# Patient Record
Sex: Male | Born: 1945 | Race: White | Hispanic: No | Marital: Single | Smoking: Former smoker
Health system: Southern US, Community
[De-identification: ages and names within clinical notes are randomized; demographics above are authoritative.]

## PROBLEM LIST (undated history)

## (undated) DIAGNOSIS — E785 Hyperlipidemia, unspecified: Secondary | ICD-10-CM

## (undated) DIAGNOSIS — I639 Cerebral infarction, unspecified: Secondary | ICD-10-CM

## (undated) DIAGNOSIS — R51 Headache: Principal | ICD-10-CM

## (undated) DIAGNOSIS — E119 Type 2 diabetes mellitus without complications: Secondary | ICD-10-CM

## (undated) DIAGNOSIS — K219 Gastro-esophageal reflux disease without esophagitis: Secondary | ICD-10-CM

## (undated) DIAGNOSIS — H539 Unspecified visual disturbance: Secondary | ICD-10-CM

## (undated) DIAGNOSIS — R002 Palpitations: Secondary | ICD-10-CM

## (undated) DIAGNOSIS — I10 Essential (primary) hypertension: Secondary | ICD-10-CM

## (undated) DIAGNOSIS — E0842 Diabetes mellitus due to underlying condition with diabetic polyneuropathy: Secondary | ICD-10-CM

## (undated) DIAGNOSIS — R519 Headache, unspecified: Secondary | ICD-10-CM

## (undated) HISTORY — DX: Gastro-esophageal reflux disease without esophagitis: K21.9

## (undated) HISTORY — DX: Essential (primary) hypertension: I10

## (undated) HISTORY — DX: Type 2 diabetes mellitus without complications: E11.9

## (undated) HISTORY — PX: DENTAL SURGERY: SHX609

## (undated) HISTORY — PX: OTHER SURGICAL HISTORY: SHX169

## (undated) HISTORY — DX: Headache: R51

## (undated) HISTORY — DX: Headache, unspecified: R51.9

## (undated) HISTORY — PX: BACK SURGERY: SHX140

## (undated) HISTORY — DX: Cerebral infarction, unspecified: I63.9

## (undated) HISTORY — DX: Palpitations: R00.2

## (undated) HISTORY — DX: Hyperlipidemia, unspecified: E78.5

## (undated) HISTORY — PX: SHOULDER SURGERY: SHX246

## (undated) HISTORY — DX: Unspecified visual disturbance: H53.9

## (undated) HISTORY — DX: Diabetes mellitus due to underlying condition with diabetic polyneuropathy: E08.42

---

## 2000-07-30 ENCOUNTER — Emergency Department (HOSPITAL_COMMUNITY): Admission: EM | Admit: 2000-07-30 | Discharge: 2000-07-31 | Payer: Self-pay | Admitting: Emergency Medicine

## 2001-09-04 ENCOUNTER — Other Ambulatory Visit: Admission: RE | Admit: 2001-09-04 | Discharge: 2001-09-04 | Payer: Self-pay | Admitting: Otolaryngology

## 2001-09-13 ENCOUNTER — Ambulatory Visit (HOSPITAL_COMMUNITY): Admission: RE | Admit: 2001-09-13 | Discharge: 2001-09-13 | Payer: Self-pay | Admitting: Otolaryngology

## 2001-09-13 ENCOUNTER — Encounter: Payer: Self-pay | Admitting: Otolaryngology

## 2001-09-18 ENCOUNTER — Other Ambulatory Visit: Admission: RE | Admit: 2001-09-18 | Discharge: 2001-09-18 | Payer: Self-pay | Admitting: Otolaryngology

## 2001-09-21 ENCOUNTER — Encounter: Admission: RE | Admit: 2001-09-21 | Discharge: 2001-09-21 | Payer: Self-pay | Admitting: Otolaryngology

## 2001-09-21 ENCOUNTER — Encounter: Payer: Self-pay | Admitting: Otolaryngology

## 2001-10-12 ENCOUNTER — Ambulatory Visit (HOSPITAL_BASED_OUTPATIENT_CLINIC_OR_DEPARTMENT_OTHER): Admission: RE | Admit: 2001-10-12 | Discharge: 2001-10-12 | Payer: Self-pay | Admitting: Otolaryngology

## 2013-12-16 DIAGNOSIS — R9439 Abnormal result of other cardiovascular function study: Secondary | ICD-10-CM

## 2014-01-11 DIAGNOSIS — E1122 Type 2 diabetes mellitus with diabetic chronic kidney disease: Secondary | ICD-10-CM

## 2014-01-11 DIAGNOSIS — IMO0002 Reserved for concepts with insufficient information to code with codable children: Secondary | ICD-10-CM

## 2014-01-11 DIAGNOSIS — I493 Ventricular premature depolarization: Secondary | ICD-10-CM

## 2014-01-11 DIAGNOSIS — E1165 Type 2 diabetes mellitus with hyperglycemia: Secondary | ICD-10-CM

## 2014-01-13 ENCOUNTER — Encounter (HOSPITAL_COMMUNITY)
Admission: RE | Disposition: A | Discharge status: Home / Self Care | Payer: Self-pay | Source: Ambulatory Visit | Attending: Cardiology

## 2014-01-13 ENCOUNTER — Ambulatory Visit (HOSPITAL_COMMUNITY)
Admission: RE | Admit: 2014-01-13 | Discharge: 2014-01-13 | Disposition: A | Discharge status: Home / Self Care | Payer: Medicare Other | Source: Ambulatory Visit | Attending: Cardiology | Admitting: Cardiology

## 2014-01-13 ENCOUNTER — Encounter (HOSPITAL_COMMUNITY): Payer: Self-pay | Admitting: Cardiology

## 2014-01-13 DIAGNOSIS — E785 Hyperlipidemia, unspecified: Secondary | ICD-10-CM | POA: Diagnosis not present

## 2014-01-13 DIAGNOSIS — E1122 Type 2 diabetes mellitus with diabetic chronic kidney disease: Secondary | ICD-10-CM

## 2014-01-13 DIAGNOSIS — R9439 Abnormal result of other cardiovascular function study: Secondary | ICD-10-CM

## 2014-01-13 DIAGNOSIS — E119 Type 2 diabetes mellitus without complications: Secondary | ICD-10-CM | POA: Diagnosis not present

## 2014-01-13 DIAGNOSIS — I10 Essential (primary) hypertension: Secondary | ICD-10-CM | POA: Insufficient documentation

## 2014-01-13 DIAGNOSIS — IMO0002 Reserved for concepts with insufficient information to code with codable children: Secondary | ICD-10-CM

## 2014-01-13 DIAGNOSIS — I493 Ventricular premature depolarization: Secondary | ICD-10-CM | POA: Insufficient documentation

## 2014-01-13 DIAGNOSIS — E1165 Type 2 diabetes mellitus with hyperglycemia: Secondary | ICD-10-CM

## 2014-01-13 HISTORY — PX: LEFT HEART CATHETERIZATION WITH CORONARY ANGIOGRAM: SHX5451

## 2014-01-13 LAB — GLUCOSE, CAPILLARY: GLUCOSE-CAPILLARY: 274 mg/dL — AB (ref 70–99)

## 2014-01-13 SURGERY — LEFT HEART CATHETERIZATION WITH CORONARY ANGIOGRAM
Anesthesia: LOCAL

## 2014-01-13 MED ORDER — ASPIRIN 81 MG PO CHEW
CHEWABLE_TABLET | ORAL | Status: AC
  Filled 2014-01-13: qty 1

## 2014-01-13 MED ORDER — SODIUM CHLORIDE 0.9 % IV SOLN
250.0000 mL | INTRAVENOUS | Status: DC | PRN
Start: 2014-01-13 — End: 2014-01-13

## 2014-01-13 MED ORDER — HYDROMORPHONE HCL 1 MG/ML IJ SOLN
INTRAMUSCULAR | Status: AC
  Filled 2014-01-13: qty 1

## 2014-01-13 MED ORDER — MIDAZOLAM HCL 2 MG/2ML IJ SOLN
INTRAMUSCULAR | Status: AC
  Filled 2014-01-13: qty 2

## 2014-01-13 MED ORDER — ASPIRIN 81 MG PO CHEW
81.0000 mg | CHEWABLE_TABLET | ORAL | Status: AC
  Administered 2014-01-13: 81 mg via ORAL

## 2014-01-13 MED ORDER — METFORMIN HCL 1000 MG PO TABS: 1000.0000 mg | ORAL_TABLET | Freq: Two times a day (BID) | ORAL | Status: DC

## 2014-01-13 MED ORDER — SODIUM CHLORIDE 0.9 % IV SOLN
INTRAVENOUS | Status: DC
  Administered 2014-01-13: 06:00:00 via INTRAVENOUS

## 2014-01-13 MED ORDER — SODIUM CHLORIDE 0.9 % IV SOLN: 1.0000 mL/kg/h | INTRAVENOUS | Status: DC

## 2014-01-13 MED ORDER — SODIUM CHLORIDE 0.9 % IJ SOLN: 3.0000 mL | INTRAMUSCULAR | Status: DC | PRN

## 2014-01-13 MED ORDER — SODIUM CHLORIDE 0.9 % IV BOLUS (SEPSIS)
500.0000 mL | Freq: Once | INTRAVENOUS | Status: AC
  Administered 2014-01-13: 06:00:00 via INTRAVENOUS

## 2014-01-13 MED ORDER — SODIUM CHLORIDE 0.9 % IJ SOLN: 3.0000 mL | Freq: Two times a day (BID) | INTRAMUSCULAR | Status: DC

## 2014-01-13 NOTE — Discharge Instructions (Signed)
Radial Site Care °Refer to this sheet in the next few weeks. These instructions provide you with information on caring for yourself after your procedure. Your caregiver may also give you more specific instructions. Your treatment has been planned according to current medical practices, but problems sometimes occur. Call your caregiver if you have any problems or questions after your procedure. °HOME CARE INSTRUCTIONS °· You may shower the day after the procedure. Remove the bandage (dressing) and gently wash the site with plain soap and water. Gently pat the site dry. °· Do not apply powder or lotion to the site. °· Do not submerge the affected site in water for 3 to 5 days. °· Inspect the site at least twice daily. °· Do not flex or bend the affected arm for 24 hours. °· No lifting over 5 pounds (2.3 kg) for 5 days after your procedure. °· Do not drive home if you are discharged the same day of the procedure. Have someone else drive you. °· You may drive 24 hours after the procedure unless otherwise instructed by your caregiver. °· Do not operate machinery or power tools for 24 hours. °· A responsible adult should be with you for the first 24 hours after you arrive home. °What to expect: °· Any bruising will usually fade within 1 to 2 weeks. °· Blood that collects in the tissue (hematoma) may be painful to the touch. It should usually decrease in size and tenderness within 1 to 2 weeks. °SEEK IMMEDIATE MEDICAL CARE IF: °· You have unusual pain at the radial site. °· You have redness, warmth, swelling, or pain at the radial site. °· You have drainage (other than a small amount of blood on the dressing). °· You have chills. °· You have a fever or persistent symptoms for more than 72 hours. °· You have a fever and your symptoms suddenly get worse. °· Your arm becomes pale, cool, tingly, or numb. °· You have heavy bleeding from the site. Hold pressure on the site and call 911. °Document Released: 02/05/2010 Document  Revised: 03/28/2011 Document Reviewed: 02/05/2010 °ExitCare® Patient Information ©2015 [employer]. This information is not intended to replace advice given to you by your health care provider. Make sure you discuss any questions you have with your health care provider. ° °

## 2014-01-13 NOTE — CV Procedure (Signed)
Procedure performed:  Left heart catheterization including hemodynamic monitoring of the left ventricle, LV gram, selective right and left coronary arteriography. Abdominal aortogram and non-selective bilateral renal arteriogram.  Indication patient is a 68 year-old male with history of hypertension,  hyperlipidemia,  Diabetes Mellitus   who presents with frequent PVC on exam and palpitations. Patient has  had non invasive testing which was high risk abnormal with decreased EF and severe inferior ischemia. Also has had difficulty in control of HTN. Hence is brought to the cardiac catheterization lab to evaluate the  coronary anatomy for definitive diagnosis of CAD and to evaluate for renal atheresclerosis.  Hemodynamic data:  Left ventricular pressure was 110/3 with LVEDP of 8 mm mercury. Aortic pressure was 110/58 with a mean of 79 mm mercury. There was no pressure gradient across the aortic valve  Left ventricle: Performed in the RAO projection revealed LVEF of 55-60 %. There was no significant MR. no wall motion abnormality.  Right coronary artery: Dominant. Mild luminal irregularity. Mildly tortuous and has a mildly anterior origin. The PDA and PL branches bifurcate early, PL branch has secondary branch. There very large size vessel.  Left main coronary artery is large and normal.  Circumflex coronary artery: A moderate sized vessel giving origin to a small to moderate sized obtuse marginal 1. It is smooth and normal.   LAD:  LAD gives origin to a large  diagonal 1 and D2, mild disease is evident with a 40% due to stenosis in the midsegment. LAD has proximal 30-40% stenosis and mid 30-40% stenosis. There is diffuse mild luminal irregularities.   Ramus intermediate: Large vessel, normal.  Abdominal aortogram and nonselective bilateral renal arteriogram: This revealed mild abdominal atherosclerotic changes. There is one renal artery on the left which is widely patent, 2 renal arteries on the  right the superior pole renal artery is much larger, they're widely patent and normal. There is no obvious abdominal aortic aneurysm.   Technique: Under sterile precautions using a 6 French right radial  arterial access, a 6 French sheath was introduced into the right radial artery. A 5 [place] Tig 4 catheter was advanced into the ascending aorta selective   left coronary artery was cannulated and angiography was performed in multiple views. The catheter was pulled back Out of the body over exchange length J-wire. I also attempted to engage the right coronary artery with a 6 French JR4 125 cm catheter, due to anterior origin, this was abandoned, the 6 [place] catheter was then advanced into the abdominal aorta and abdominal aortogram was performed with visualization of the renal arteries. The catheter then exchanged to a 5 [place] AR-1, right coronary artery was selected engaged and angiography was performed. The left ventricle gram was performed using a tiger 4 catheter and the same catheter was also advanced into the abdominal aorta and left renal artery was visualized. Catheter exchanged out of the body over J-Wire. NO immediate complications noted. Patient tolerated the procedure well.   Rec: Medical therapy with aggressive risk factor reduction. No significant coronary artery disease. Proximal and mid LAD has at most a 40% stenosis and the diagonal 2 has made 30-40% stenosis. Normal LVEF. No evidence of renal artery stenosis. No evidence of abdominal aortic aneurysm. A total of 120 mL of contrast was utilized for diagnostic angiography.   Disposition: Will be discharged home today with outpatient follow up.

## 2014-01-13 NOTE — H&P (Signed)
  Please see office visit notes for complete details of HPI.  

## 2014-01-13 NOTE — Interval H&P Note (Signed)
Cath Lab Visit (complete for each Cath Lab visit)  Clinical Evaluation Leading to the Procedure:   ACS: No.  Non-ACS:    Anginal Classification: CCS I  Anti-ischemic medical therapy: Maximal Therapy (2 or more classes of medications)  Non-Invasive Test Results: High-risk stress test findings: cardiac mortality >3%/year  Prior CABG: No previous CABG      History and Physical Interval Note:  01/13/2014 7:46 AM  Carl Miller  has presented today for surgery, with the diagnosis of pvc  The various methods of treatment have been discussed with the patient and family. After consideration of risks, benefits and other options for treatment, the patient has consented to  Procedure(s): LEFT HEART CATHETERIZATION WITH CORONARY ANGIOGRAM (N/A) as a surgical intervention .  The patient's history has been reviewed, patient examined, no change in status, stable for surgery.  I have reviewed the patient's chart and labs.  Questions were answered to the patient's satisfaction.     Pamella PertGANJI,JAGADEESH R

## 2014-01-28 ENCOUNTER — Encounter: Payer: Self-pay | Admitting: *Deleted

## 2014-01-28 ENCOUNTER — Encounter: Payer: Self-pay | Admitting: Neurology

## 2014-01-28 ENCOUNTER — Ambulatory Visit (INDEPENDENT_AMBULATORY_CARE_PROVIDER_SITE_OTHER): Payer: Medicare Other | Admitting: Neurology

## 2014-01-28 VITALS — BP 140/83 | HR 78 | Resp 20 | Ht 71.0 in | Wt 210.0 lb

## 2014-01-28 DIAGNOSIS — G44009 Cluster headache syndrome, unspecified, not intractable: Secondary | ICD-10-CM | POA: Insufficient documentation

## 2014-01-28 DIAGNOSIS — Z794 Long term (current) use of insulin: Secondary | ICD-10-CM

## 2014-01-28 DIAGNOSIS — J3489 Other specified disorders of nose and nasal sinuses: Secondary | ICD-10-CM

## 2014-01-28 DIAGNOSIS — E0842 Diabetes mellitus due to underlying condition with diabetic polyneuropathy: Secondary | ICD-10-CM

## 2014-01-28 DIAGNOSIS — R51 Headache: Secondary | ICD-10-CM

## 2014-01-28 DIAGNOSIS — R0683 Snoring: Secondary | ICD-10-CM | POA: Insufficient documentation

## 2014-01-28 DIAGNOSIS — R519 Headache, unspecified: Secondary | ICD-10-CM

## 2014-01-28 DIAGNOSIS — R0981 Nasal congestion: Secondary | ICD-10-CM | POA: Insufficient documentation

## 2014-01-28 DIAGNOSIS — E1165 Type 2 diabetes mellitus with hyperglycemia: Secondary | ICD-10-CM | POA: Insufficient documentation

## 2014-01-28 HISTORY — DX: Headache, unspecified: R51.9

## 2014-01-28 HISTORY — DX: Diabetes mellitus due to underlying condition with diabetic polyneuropathy: E08.42

## 2014-01-28 NOTE — Progress Notes (Signed)
SLEEP MEDICINE CLINIC   Provider:  Melvyn Novas, M D  Referring Provider: Pamella Pert, MD Primary Care Physician:  Benedetto Goad, MD   Chief Complaint  Patient presents with  . NP  Ganji  Sleep consult    Rm 10, alone    HPI:  Carl Miller is a 69 y.o. caucasian, right handed  male  seen here as a referral  by Dr. Jacinto Halim for a sleep evaluation,  Mr. Cocozza is a new patient to my practice who reports that he suffers from not getting enough sleep and frequent headaches. He also has chronic nasal congestion and a runny nose numbness and weakness joint pain, frequent coughing, and some pain behind the eyes he often reports to have a sharp pain as if he is stepped into the eyeball and this pain radiates towards his left temple from the left orbit. This sharp pain can last days he reports. He sometimes gets sick to his stomach from the headaches up to the level where he had to vomit. The patient is followed by Dr. Adelfa Koh for palpitations and carries a control the diagnosis of poorly controlled diabetes at this time, hyperlipidemia and hypertension. An EKG stool Dr. Andrey Campanile his primary care physician showed frequent PVCs. He had intermittent palpitations over several years. A nuclear stress test was performed and revealed a large size moderate ischemia in the inferior and inferior lateral wall of his heart and he was scheduled for a cardiac catheterization due to this finding. The angiogram showed mild noncritical coronary artery disease he carries a cardiac monitor. He denies chest pain and shortness of breath but he is markedly sedentary and he has chronic swelling around the ankles. Dr. Jacinto Halim wondered if his headaches may be related to a sleep disorder.  The patient reports going to bed between 9:30 and 10:15 usually he will fall initially asleep promptly but he will not sleep through the night. He often wakes up between 1:30 and 2:30 and has trouble without taking any sleep aid to reinitiate  sleep. He often also has naps in daytime but he feels too tired. His children have witnessed that she snores very loudly. And he is a habitual mouth breather. This led Dr. Adline Potter G2 order a nocturnal pulse oximetry.  The pulse oximetry showed significant oxygen desaturations at night and he is referred to be evaluated for sleep apnea . The pulse oximetry was performed on 12-19-13 and documented variable heart rates between 47 bpm and 91 bpm, awake oxygen saturation was 97% lowest oxygen saturation at night was 81% the time at or under 89% for 16 minutes. Most of the hypoxia periods fall into the time after 2 AM when the patient was potentially in motion. He would have in general rather mild chronic headaches over the last months he has experienced more severe sharp nocturnal headaches that wake him out of sleep 2. He possibly suffers from cluster headaches by his description. He also has fragmented sleep he goes to the bathroom up to 3 times at night which further does not allow him to sleep through. If he wakes up early enough before midnight  he will take a sleeping pill  ( Restoril ) trying to get more than 4 hours. He reports feeling groggy after taking meds. Not restored, not refreshed in the morning . He has a very dry mouth. He feels better after the first cup of coffee. The headaches do lack a peak time, he has had daily headaches  for 6 month, the stabbing headaches for about 5 weeks.   I reviewed the long list of medications and his recent tests and labs. His glucose is elevated - was 350 at Dr Verl Dicker last visit.   No history of neck surgery, air way surgery. He had mastoid surgery - he wears hearing aids. He has fractured his nose many times, has no nasal airflow and a nasal speech.  He is unaware of a sleep disorder in his family members.   He is divorced, has adult children ( 39,  33 ) and six grandchildren, all healthy. He is a former smoker, quit in 1999, Drank frequently  ETOH :Quit 18 month  ago.  Lost weight  Since about 30 pounds.       Review of Systems: Out of a complete 14 system review, the patient complains of only the following symptoms, and all other reviewed systems are negative. Excessive daytime sleepiness with a residual Epworth of 11 points while taking daily naps. Fatigue severity score however is only 14 points and the patient does not appear depressed. His geriatric depression score was at 2 points.   Epworth score  11, Fatigue severity score  , depression score 2   History   Social History  . Marital Status: Single    Spouse Name: N/A    Number of Children: N/A  . Years of Education: N/A   Occupational History  . Not on file.   Social History Main Topics  . Smoking status: Former Games developer  . Smokeless tobacco: Former Neurosurgeon    Quit date: 01/17/1997  . Alcohol Use: No     Comment: quit 12/2012  . Drug Use: No  . Sexual Activity: Not on file   Other Topics Concern  . Not on file   Social History Narrative   Caffeine 30 ounces/ daily  (2 diet Mtn Dew/ week. Retired, lives alone.  Divorced.     Family History  Problem Relation Age of Onset  . Stroke Mother   . Diabetes Mother   . Heart attack Father     Past Medical History  Diagnosis Date  . GERD (gastroesophageal reflux disease)   . Diabetes mellitus   . Hyperlipidemia   . Hypertension   . Palpitations   . Vision abnormalities   . Headache   . Worsening headaches 01/28/2014  . Diabetes mellitus due to underlying condition with diabetic polyneuropathy 01/28/2014    Past Surgical History  Procedure Laterality Date  . Left heart catheterization with coronary angiogram N/A 01/13/2014    Procedure: LEFT HEART CATHETERIZATION WITH CORONARY ANGIOGRAM;  Surgeon: Pamella Pert, MD;  Location: [facility] CATH LAB;  Service: Cardiovascular;  Laterality: N/A;  . Back surgery    . Dental surgery    . Lithotomy    . Shoulder surgery      Current Outpatient Prescriptions  Medication Sig  Dispense Refill  . alendronate (FOSAMAX) 70 MG tablet Take 70 mg by mouth every Saturday. Take with a full glass of water on an empty stomach.    Marland Kitchen amLODipine (NORVASC) 5 MG tablet Take 5 mg by mouth daily.    Marland Kitchen aspirin EC 81 MG tablet Take 81 mg by mouth daily.    Marland Kitchen atorvastatin (LIPITOR) 40 MG tablet Take 40 mg by mouth daily.    . carvedilol (COREG) 12.5 MG tablet Take 12.5 mg by mouth 2 (two) times daily with a meal.    . dorzolamide (TRUSOPT) 2 % ophthalmic solution Place 1  drop into both eyes 2 (two) times daily.    . fenofibrate 160 MG tablet     . furosemide (LASIX) 20 MG tablet Take 20 mg by mouth 2 (two) times daily.    Marland Kitchen. gemfibrozil (LOPID) 600 MG tablet Take 600 mg by mouth 2 (two) times daily before a meal.    . glimepiride (AMARYL) 4 MG tablet     . ibuprofen (ADVIL,MOTRIN) 800 MG tablet Take 800 mg by mouth 3 (three) times daily as needed for mild pain.    Bess Harvest. Icosapent Ethyl (VASCEPA) 1 G CAPS Take 1 g by mouth 2 (two) times daily.    Marland Kitchen. latanoprost (XALATAN) 0.005 % ophthalmic solution Place 1 drop into both eyes at bedtime.    . metFORMIN (GLUCOPHAGE) 1000 MG tablet Take 1 tablet (1,000 mg total) by mouth 2 (two) times daily with a meal.    . Multiple Vitamin (MULTIVITAMIN WITH MINERALS) TABS tablet Take 1 tablet by mouth daily.    Marland Kitchen. omeprazole (PRILOSEC) 20 MG capsule Take 20 mg by mouth daily.    . pantoprazole (PROTONIX) 40 MG tablet     . sitaGLIPtin (JANUVIA) 100 MG tablet Take 100 mg by mouth daily.    Marland Kitchen. spironolactone (ALDACTONE) 25 MG tablet     . temazepam (RESTORIL) 30 MG capsule Take 30 mg by mouth at bedtime as needed for sleep.    . valsartan (DIOVAN) 320 MG tablet Take 320 mg by mouth daily.    . Vitamin D, Ergocalciferol, (DRISDOL) 50000 UNITS CAPS capsule Take 50,000 Units by mouth every Saturday.     No current facility-administered medications for this visit.    Allergies as of 01/28/2014 - Review Complete 01/28/2014  Allergen Reaction Noted  . Tape Other  (See Comments) 01/13/2014    Vitals: BP 140/83 mmHg  Pulse 78  Resp 20  Ht 5\' 11"  (1.803 m)  Wt 210 lb (95.255 kg)  BMI 29.30 kg/m2 Last Weight:  Wt Readings from Last 1 Encounters:  01/28/14 210 lb (95.255 kg)       Last Height:   Ht Readings from Last 1 Encounters:  01/28/14 5\' 11"  (1.803 m)    Physical exam:  General: The patient is awake, alert and appears not in acute distress. The patient is well groomed. Head: Normocephalic, atraumatic. Neck is supple. Mallampati 3   neck circumference: 17 inches . Nasal airflow not detected.  TMJ is  Evident- he has had surgery  . Retrognathia is  Not seen.  Cardiovascular:  Regular rate and rhythm no skipped beats , without  murmurs or carotid bruit, and without distended neck veins. Respiratory: Lungs are clear to auscultation. Skin:  Without evidence of edema, or rash Trunk: BMI is  elevated and patient has normal posture.  Neurologic exam : The patient is awake and alert, oriented to place and time.   Memory subjective  described as intact. There is a normal attention span & concentration ability. Speech is fluent without  dysarthria, but with dysphonia Mood and affect are appropriate.  Cranial nerves: Pupils are equal and briskly reactive to light. Funduscopic exam without evidence of pallor or edema.  Extraocular movements  in vertical and horizontal planes intact and without nystagmus.  Status post cataract surgery. Visual fields by finger perimetry are intact. Hearing severly impaired , the patient perceives much better bone than air conduction for vibration test.  Facial sensation intact to fine touch.  Facial motor strength is symmetric and tongue and uvula move midline.  Motor exam:   Normal tone, muscle bulk and symmetric strength in all extremities.  Sensory:  Fine touch, pinprick and vibration were tested in all extremities. Vibration and fine touch are virtually absent from the mid cough level down the patient also has  ankle edema which can reduce his sensory capacity.  Proprioception  abnormal.  Coordination: Rapid alternating movements in the fingers/hands is normal.  Finger-to-nose maneuver  normal without evidence of ataxia, dysmetria or tremor.  Gait and station: Patient walks without assistive device and is able unassisted to climb up to the exam table.  Strength within normal limits. Stance is  Wide based , stable ,Tandem gait is deferred. Heel and toe walk is unsteady, right foot is weaker.    Deep tendon reflexes: in the  upper and lower extremities are attenuated. . Babinski maneuver response is equivocal    Assessment:  After physical and neurologic examination, review of laboratory studies, imaging, neurophysiology testing and pre-existing records, assessment is    The patient was advised of the nature of the diagnosed sleep disorder , the treatment options and risks for general a health and wellness arising from not treating the condition. Visit duration was 45  minutes.  More than 50 % of the face to face time were needed to discuss the exam, the diagnosis and possible treatment, including ENT procedures and sleep hygiene implementation in detail.  1) test for OSA and hypoxemia,  2) probable Co2 retention. Former smoker,  3) Cluster headaches. Can be related to hypoxemia.  4) diabetic induced nocturia and neuropathy.   Plan:  Treatment plan and additional workup : Split study order , with AHI 15 , and score at 4%.  We'll need CO2 to be measured and we need to have a keen eye on his oxygen levels at night. I think that there is a good chance that hypoxemia and hypercapnia could promote headaches in this patient. 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 He would be a habitual mouth breather due to this. I will be happy to prescribe him a nasal spray to see if we can open the nasal passage a little further to make CPAP possible, but it is  likely that he would need to have an ENT approach to open his nasal passages this doesn't work. I will meet with the patient after the sleep study has been completed and will keep Dr. Reece Agar. and Dr. Andrey Campanile informed about the results.   Thank you for allowing me to participate in this pleasant patient's care don't hesitate to call me with any further questions that may arise.  Salena Saner Lorean Ekstrand M.D.      Porfirio Mylar Cassara Nida MD  01/28/2014

## 2014-02-17 ENCOUNTER — Ambulatory Visit (INDEPENDENT_AMBULATORY_CARE_PROVIDER_SITE_OTHER): Payer: Medicare Other | Admitting: Neurology

## 2014-02-17 VITALS — BP 154/87 | HR 88 | Resp 20

## 2014-02-17 DIAGNOSIS — J3489 Other specified disorders of nose and nasal sinuses: Secondary | ICD-10-CM

## 2014-02-17 DIAGNOSIS — R351 Nocturia: Secondary | ICD-10-CM

## 2014-02-17 DIAGNOSIS — E0842 Diabetes mellitus due to underlying condition with diabetic polyneuropathy: Secondary | ICD-10-CM

## 2014-02-17 DIAGNOSIS — G4733 Obstructive sleep apnea (adult) (pediatric): Secondary | ICD-10-CM

## 2014-02-17 DIAGNOSIS — R0683 Snoring: Secondary | ICD-10-CM

## 2014-02-17 DIAGNOSIS — R0981 Nasal congestion: Secondary | ICD-10-CM

## 2014-02-17 DIAGNOSIS — G44009 Cluster headache syndrome, unspecified, not intractable: Secondary | ICD-10-CM

## 2014-02-17 DIAGNOSIS — R51 Headache: Secondary | ICD-10-CM

## 2014-02-17 DIAGNOSIS — R519 Headache, unspecified: Secondary | ICD-10-CM

## 2014-02-17 NOTE — Sleep Study (Signed)
Please see the scanned sleep study interpretation located in the Procedure tab within the Chart Review section. 

## 2014-03-06 ENCOUNTER — Telehealth: Payer: Self-pay | Admitting: *Deleted

## 2014-03-06 ENCOUNTER — Other Ambulatory Visit: Payer: Self-pay | Admitting: Neurology

## 2014-03-06 DIAGNOSIS — G4733 Obstructive sleep apnea (adult) (pediatric): Secondary | ICD-10-CM

## 2014-03-06 NOTE — Telephone Encounter (Signed)
Patient was contacted and provided the results of his NPSG which revealed obstructive sleep apnea.  Patient was informed that a CPAP titration study was recommended for treatment.  Patient is not excited about the idea of using CPAP but was agreeable to schedule his study.  Patient was scheduled for 03/08/2014 at 09:30 pm.  Dr. Jacinto HalimGanji was faxed a copy of the report.

## 2014-03-08 ENCOUNTER — Ambulatory Visit (INDEPENDENT_AMBULATORY_CARE_PROVIDER_SITE_OTHER): Payer: Medicare Other | Admitting: Neurology

## 2014-03-08 DIAGNOSIS — G4733 Obstructive sleep apnea (adult) (pediatric): Secondary | ICD-10-CM

## 2014-03-09 NOTE — Sleep Study (Signed)
Please see the scanned sleep study interpretation located in the Procedure tab within the Chart Review section. 

## 2014-03-18 ENCOUNTER — Telehealth: Payer: Self-pay | Admitting: *Deleted

## 2014-03-18 ENCOUNTER — Other Ambulatory Visit: Payer: Self-pay | Admitting: Neurology

## 2014-03-18 ENCOUNTER — Encounter: Payer: Self-pay | Admitting: *Deleted

## 2014-03-18 ENCOUNTER — Encounter: Payer: Self-pay | Admitting: Neurology

## 2014-03-18 DIAGNOSIS — G4733 Obstructive sleep apnea (adult) (pediatric): Secondary | ICD-10-CM

## 2014-03-18 NOTE — Telephone Encounter (Signed)
Patient was contacted and provided the results of his CPAP titration sleep study.  He was advised that CPAP therapy was recommended for treatment for home use.  Patient is somewhat apprehensive but agreed to be referred to [facility] for CPAP set up.  Patient lives in [place] and is familiar with where their office is.  Dr. Yates DecampJay Ganji was faxed a copy of the report.  The patient gave verbal permission to mail a copy of his test results.   Patient instructed to contact our office 6-8 weeks post set up to schedule a follow up appointment.

## 2014-03-20 ENCOUNTER — Telehealth: Payer: Self-pay | Admitting: *Deleted

## 2014-03-20 NOTE — Telephone Encounter (Signed)
Patient was contacted because his CPAP referral was sent to [facility] and they have recently lost their contract with Medicare his insurance.  A referral was sent to [facility] for processing.  Patient was contacted so to be informed.

## 2014-05-05 ENCOUNTER — Telehealth: Payer: Self-pay | Admitting: *Deleted

## 2014-05-05 NOTE — Telephone Encounter (Signed)
I called pt and made appt for RV for sleep test results (CPAP).   Pt verbalized understanding.

## 2014-05-23 ENCOUNTER — Ambulatory Visit (INDEPENDENT_AMBULATORY_CARE_PROVIDER_SITE_OTHER): Payer: Medicare Other | Admitting: Neurology

## 2014-05-23 ENCOUNTER — Encounter: Payer: Self-pay | Admitting: Neurology

## 2014-05-23 VITALS — BP 124/81 | HR 81 | Resp 20 | Ht 71.0 in | Wt 210.0 lb

## 2014-05-23 DIAGNOSIS — G4733 Obstructive sleep apnea (adult) (pediatric): Secondary | ICD-10-CM | POA: Insufficient documentation

## 2014-05-23 DIAGNOSIS — G44019 Episodic cluster headache, not intractable: Secondary | ICD-10-CM

## 2014-05-23 DIAGNOSIS — R0902 Hypoxemia: Secondary | ICD-10-CM

## 2014-05-23 DIAGNOSIS — Z9989 Dependence on other enabling machines and devices: Secondary | ICD-10-CM

## 2014-05-23 NOTE — Progress Notes (Signed)
SLEEP MEDICINE CLINIC   Provider:  Melvyn Novas, M D  Referring Provider: No ref. provider found Primary Care Physician:  Benedetto Goad, MD   Chief Complaint  Patient presents with  . Follow-up    sleep study f/u, rm11, alone    HPI:  Carl Miller is a 69 y.o. caucasian, right handed  male  seen here as a referral  by Dr. No ref. provider found for a sleep evaluation,  Carl Miller is a new patient to my practice who reports that he suffers from not getting enough sleep and frequent headaches. He also has chronic nasal congestion and a runny nose numbness and weakness joint pain, frequent coughing, and some pain behind the eyes he often reports to have a sharp pain as if he is stepped into the eyeball and this pain radiates towards his left temple from the left orbit. This sharp pain can last days he reports. He sometimes gets sick to his stomach from the headaches up to the level where he had to vomit. Dr. Jacinto Halim wondered if his headaches may be related to a sleep disorder.  The patient reports going to bed between 9:30 and 10:15 usually he will fall initially asleep promptly but he will not sleep through the night. He often wakes up between 1:30 and 2:30 and has trouble without taking any sleep aid to reinitiate sleep. He often also has naps in daytime but he feels too tired.  His children have witnessed that she snores very loudly. And he is a habitual mouth breather. This led Dr. Jacinto Halim to order a nocturnal pulse oximetry.  The pulse oximetry showed significant oxygen desaturations at night and he is referred to be evaluated for sleep apnea . The pulse oximetry was performed on 12-19-13 and documented variable heart rates between 47 bpm and 91 bpm, awake oxygen saturation was 97% lowest oxygen saturation at night was 81% the time at or under 89% for 16 minutes. Most of the hypoxia periods fall into the time after 2 AM when the patient was potentially in motion. He would have in general  rather mild chronic headaches over the last months he has experienced more severe sharp nocturnal headaches that wake him out of sleep 2. He possibly suffers from cluster headaches by his description. He also has fragmented sleep he goes to the bathroom up to 3 times at night which further does not allow him to sleep through. If he wakes up early enough before midnight  he will take a sleeping pill  ( Restoril ) trying to get more than 4 hours. He reports feeling groggy after taking meds. Not restored, not refreshed in the morning . He has a very dry mouth. He feels better after the first cup of coffee. The headaches do lack a peak time, he has had daily headaches for 6 month, the stabbing headaches for about 5 weeks.   I reviewed the long list of medications and his recent tests and labs. His glucose is elevated - was 350 at Dr Verl Dicker last visit.   No history of neck surgery, air way surgery. He had mastoid surgery - he wears hearing aids. He has fractured his nose many times, has no nasal airflow and a nasal speech.  He is unaware of a sleep disorder in his family members.   He is divorced, has adult children ( 39,  76 ) and six grandchildren, all healthy. He is a former smoker, quit in 1999, he is retired Production designer, theatre/television/film  technician  Drank frequently  ETOH :Quit 18 month ago.   05-23-14  Revisit note for Carl Miller, Since my initial consultation with Carl Miller he has been undergoing a sleep study adult patient with sonography performed on 02-17-14. He presented with elevated blood pressures to the sleep study night 154/87 mmHg, Epworth sleepiness score at 10 points,  PHQ at 10 points.  He was diagnosed with an overall mild obstructive sleep apnea;  his RDI was 10.6 but he had significant oxygen desaturations as low as  78% with 59.3 minutes of desaturation total.  based on this ,I ordered a CPAP for him ,especially since the headache condition he presented with was deemed to be related to hypoxemia.  I see him now today for the first time after the sleep study and after CPAP use was initiated.   He states he feels absolutely better ! He sleeps longer sounder he does not wake up frequently he has no headaches in the morning or during the night actually at this time he has no headache at all. Has rhinitis he may have nocturia twice at night. I was also able to review his compliance download. The patient was put and outer titrated and use the machine 97% of the time over 4 hours at night this is a very high compliance compliance is also 30 out of 30 days 100% 4 days of use. Average user time is 6 hours 34 minutes, set pressure is 8 cm water with 3 cm EPR residual AHI is 2.2 he does have high air leaks however he has a mustache he prefers to use a full face mask because of his rhinitis history. The patient still has his natural teeth on the lower jaw he had wears a denture on the top. He sleeps well and happy with the CPAP machine and needs no adjustments.   Review of Systems: Out of a complete 14 system review, the patient complains of only the following symptoms, and all other reviewed systems are negative. His geriatric depression score was at 2 points. He states he feels absolutely better he sleeps longer sounder he does not wake up frequently he has no headaches in the morning or during the night actually at this time he has no headache at all. Has rhinitis he may have nocturia twice at night. Still severe restriction of nasal airflow. Epworth score 8, Fatigue severity score 19 , depression score 2   History   Social History  . Marital Status: Single    Spouse Name: N/A  . Number of Children: N/A  . Years of Education: N/A   Occupational History  . Not on file.   Social History Main Topics  . Smoking status: Former Games developer  . Smokeless tobacco: Former Neurosurgeon    Quit date: 01/17/1997  . Alcohol Use: No     Comment: quit 12/2012  . Drug Use: No  . Sexual Activity: Not on file   Other  Topics Concern  . Not on file   Social History Narrative   Caffeine 30 ounces/ daily  (2 diet Mtn Dew/ week. Retired, lives alone.  Divorced.     Family History  Problem Relation Age of Onset  . Stroke Mother   . Diabetes Mother   . Heart attack Father     Past Medical History  Diagnosis Date  . GERD (gastroesophageal reflux disease)   . Diabetes mellitus   . Hyperlipidemia   . Hypertension   . Palpitations   . Vision  abnormalities   . Headache   . Worsening headaches 01/28/2014  . Diabetes mellitus due to underlying condition with diabetic polyneuropathy 01/28/2014    Past Surgical History  Procedure Laterality Date  . Left heart catheterization with coronary angiogram N/A 01/13/2014    Procedure: LEFT HEART CATHETERIZATION WITH CORONARY ANGIOGRAM;  Surgeon: Pamella PertJagadeesh R Ganji, MD;  [facility];  Service: Cardiovascular;  Laterality: N/A;  . Back surgery    . Dental surgery    . Lithotomy    . Shoulder surgery      Current Outpatient Prescriptions  Medication Sig Dispense Refill  . alendronate (FOSAMAX) 70 MG tablet Take 70 mg by mouth every Saturday. Take with a full glass of water on an empty stomach.    Marland Kitchen. amLODipine (NORVASC) 5 MG tablet Take 5 mg by mouth daily.    Marland Kitchen. aspirin EC 81 MG tablet Take 81 mg by mouth daily.    Marland Kitchen. atorvastatin (LIPITOR) 40 MG tablet Take 40 mg by mouth daily.    . carvedilol (COREG) 12.5 MG tablet Take 12.5 mg by mouth 2 (two) times daily with a meal.    . dorzolamide (TRUSOPT) 2 % ophthalmic solution Place 1 drop into both eyes 2 (two) times daily.    . fenofibrate 160 MG tablet     . furosemide (LASIX) 20 MG tablet Take 20 mg by mouth 2 (two) times daily.    Marland Kitchen. gemfibrozil (LOPID) 600 MG tablet Take 600 mg by mouth 2 (two) times daily before a meal.    . glimepiride (AMARYL) 4 MG tablet     . ibuprofen (ADVIL,MOTRIN) 800 MG tablet Take 800 mg by mouth 3 (three) times daily as needed for mild pain.    Bess Harvest. Icosapent Ethyl (VASCEPA) 1  G CAPS Take 1 g by mouth 2 (two) times daily.    Marland Kitchen. latanoprost (XALATAN) 0.005 % ophthalmic solution Place 1 drop into both eyes at bedtime.    . metFORMIN (GLUCOPHAGE) 1000 MG tablet Take 1 tablet (1,000 mg total) by mouth 2 (two) times daily with a meal.    . Multiple Vitamin (MULTIVITAMIN WITH MINERALS) TABS tablet Take 1 tablet by mouth daily.    Marland Kitchen. omeprazole (PRILOSEC) 20 MG capsule Take 20 mg by mouth daily.    . pantoprazole (PROTONIX) 40 MG tablet     . sitaGLIPtin (JANUVIA) 100 MG tablet Take 100 mg by mouth daily.    Marland Kitchen. spironolactone (ALDACTONE) 25 MG tablet     . temazepam (RESTORIL) 30 MG capsule Take 30 mg by mouth at bedtime as needed for sleep.    . valsartan (DIOVAN) 320 MG tablet Take 320 mg by mouth daily.    . Vitamin D, Ergocalciferol, (DRISDOL) 50000 UNITS CAPS capsule Take 50,000 Units by mouth every Saturday.     No current facility-administered medications for this visit.    Allergies as of 05/23/2014 - Review Complete 05/23/2014  Allergen Reaction Noted  . Tape Other (See Comments) 01/13/2014    Vitals: BP 124/81 mmHg  Pulse 81  Resp 20  Ht 5\' 11"  (1.803 m)  Wt 210 lb (95.255 kg)  BMI 29.30 kg/m2 Last Weight:  Wt Readings from Last 1 Encounters:  05/23/14 210 lb (95.255 kg)       Last Height:   Ht Readings from Last 1 Encounters:  05/23/14 5\' 11"  (1.803 m)    Physical exam:  General: The patient is awake, alert and appears not in acute distress. The patient is well  groomed. Head: Normocephalic, atraumatic. Neck is supple. Mallampati 3   neck circumference: 17 inches . Nasal airflow restricted .  TMJ is  Evident- he has had surgery  . Retrognathia is seen.  Cardiovascular:  Regular rate and rhythm no skipped beats , without  murmurs or carotid bruit, and without distended neck veins. Respiratory: Lungs are clear to auscultation. Skin:  Without evidence of edema, or rash Trunk: BMI is  elevated and patient has normal posture.  Neurologic exam : The  patient is awake and alert, oriented to place and time.   Memory subjective  described as intact. Speech is fluent with dysphonia . Mood and affect are appropriate.  Cranial nerves: Pupils are equal and briskly reactive to light. Hearing severly impaired , the patient perceives much better bone than air conduction for vibration test.  Facial sensation intact to fine touch. Facial motor strength is symmetric and tongue and uvula move midline.  Motor exam:   Normal tone, muscle bulk and symmetric strength in all extremities. Sensory:  Fine touch, pinprick and vibration were tested in all extremities. Vibration and fine touch are virtually absent from the mid calve level down the patient also has ankle edema which can reduce his sensory capacity. Proprioception  abnormal. Coordination: Rapid alternating movements in the fingers/hands is normal.  Finger-to-nose maneuver  normal without evidence of ataxia, dysmetria or tremor. Gait and station: Patient walks without assistive device and is able unassisted to climb up to the exam table.  Strength within normal limits.  Heel and toe walk is unsteady, right foot is weaker.   Deep tendon reflexes: in the  upper and lower extremities are attenuated. Babinski maneuver response is equivocal    Assessment:  After physical and neurologic examination, neurophysiology testing and pre-existing records, assessment is   1) OSA and hypoxemia,  Improved on CPAP.  2) Cluster headaches. Can be related to hypoxemia, treated with CPAP .  3) diabetic induced nocturia and neuropathy.   Plan:  Treatment plan and additional workup : I would like for Carl Miller to continue using his CPAP as he does. He is a highly compliant patient and his download from 05-21-14 revealed 97% compliance for over 4 hours of nightly use. 6034 minutes on average at 8 cm water pressure with 3 cm EPR his residual AHI is 2.2. His symptoms have clearly resolved and I attributed them not as much to  the apnea itself but to the associated hypoxemia and CO2 retention. Cluster headaches are triggered by hypoxemia.  Dr. Jacinto HalimGanji and Dr. Andrey CampanileWilson informed about the results.  Thank you for allowing me to participate in this pleasant patient's care don't hesitate to call me with any further questions that may arise.    Porfirio Mylararmen Solita Macadam MD  05/23/2014

## 2014-06-17 LAB — HEMOGLOBIN A1C: Hgb A1c MFr Bld: 12 % — AB (ref 4.0–6.0)

## 2014-07-14 ENCOUNTER — Other Ambulatory Visit: Payer: Self-pay | Admitting: Family Medicine

## 2014-07-14 DIAGNOSIS — M79605 Pain in left leg: Secondary | ICD-10-CM

## 2014-07-15 ENCOUNTER — Ambulatory Visit
Admission: RE | Admit: 2014-07-15 | Discharge: 2014-07-15 | Disposition: A | Discharge status: Home / Self Care | Payer: Medicare Other | Source: Ambulatory Visit | Attending: Family Medicine | Admitting: Family Medicine

## 2014-07-15 DIAGNOSIS — M79605 Pain in left leg: Secondary | ICD-10-CM

## 2014-08-08 LAB — HEMOGLOBIN A1C: HEMOGLOBIN A1C: 7.7 % — AB (ref 4.0–6.0)

## 2014-08-15 ENCOUNTER — Encounter: Payer: Medicare Other | Attending: Orthopedic Surgery | Admitting: Nutrition

## 2014-08-15 ENCOUNTER — Encounter: Payer: Self-pay | Admitting: Nutrition

## 2014-08-15 VITALS — Ht 72.0 in | Wt 209.0 lb

## 2014-08-15 DIAGNOSIS — E119 Type 2 diabetes mellitus without complications: Secondary | ICD-10-CM | POA: Diagnosis not present

## 2014-08-15 DIAGNOSIS — Z6828 Body mass index (BMI) 28.0-28.9, adult: Secondary | ICD-10-CM | POA: Diagnosis not present

## 2014-08-15 DIAGNOSIS — IMO0002 Reserved for concepts with insufficient information to code with codable children: Secondary | ICD-10-CM

## 2014-08-15 DIAGNOSIS — Z713 Dietary counseling and surveillance: Secondary | ICD-10-CM | POA: Diagnosis not present

## 2014-08-15 DIAGNOSIS — E118 Type 2 diabetes mellitus with unspecified complications: Secondary | ICD-10-CM

## 2014-08-15 DIAGNOSIS — E1165 Type 2 diabetes mellitus with hyperglycemia: Secondary | ICD-10-CM

## 2014-08-15 NOTE — Patient Instructions (Signed)
Goals 1. Follow MY Plate Method as discussed. 2. Avoid snacks between meals unless low blood sugars. 3. Eat 45-60 g of carbs per meal. 4. Prevent hypoglycemia. 5. Take meds as prescribed. 6. Keep liquid carb choice at bedside if BS is low. 7. Notify Dr. Fransico Him if BS are less than 70 mg/dl. 8. Exercise 30 minutes 5 days per week. 7. Get A1C to 7% or below in three months without hypoglycemia episodes.

## 2014-08-15 NOTE — Progress Notes (Signed)
  Medical Nutrition Therapy:  Appt start time: 1100 end time:  1200.  Assessment:  Primary concerns today: DIabetes.  LIves by himself. Cooks meals himself. Most foods are grilled at home.. A1C down from 12% to 7.7% om the last three months. Retired and stays active. Walks some for exercise.  Lantus 20 units at night. Januvia and Metformin 1000 mg BID. Has neuropathy in his feet. Wearing support hose socks.  He notes he has changed his diet a lot but cutting out processed foods, fast foods and fried foods.  He feels much better now that his blood sugars are better. Testing 1-2 times daily. BS are doing very well. Has had an occassional low blood sugar and woke up from night sweats. Keeps juice or carb choice at bedside now. BS much more stable now.  DIet is inconsistent with all food groups. Will work on refining his CHO intake and getting better balance of meals all around with portion sizes.Needs to cut out snacks before bedtime to help improve FBS and evening BS.   Preferred Learning Style:     No preference indicated   Learning Readiness:   Ready  Change in progress  MEDICATIONS: See list   DIETARY INTAKE:  24-hr recall:  B ( AM): 2 boiled eggs and egg and sausage mcmuffin Snk ( AM): none  L ( PM): balck eyed peas, lettuce and tomatot sandwich and 2 slices bologna. Wheat bread., water  Snk ( PM):  D ( PM): peas/carrots, popcorn, water Snk ( PM): popcorn Beverages: water, coffee  Usual physical activity:  walks  Estimated energy needs: 2000-2200 calories 225 g carbohydrates 150 g protein 56 g fat  Progress Towards Goal(s):  In progress.   Nutritional Diagnosis:  NB-1.1 Food and nutrition-related knowledge deficit As related to DIabetes.  As evidenced by A1C  was 12%, now down to 7.7%..    Intervention:  Nutrition and diabetes education provided on diet, meal planning, portion sizes, reading food labels, target ranges for blood sugars, hyper/hypoglycemia and treatment  of both, monitoring BS, taking insulin properly and benefits of exercise to reduce complications.  Goals 1. Follow MY Plate Method as discussed. 2. Avoid snacks between meals unless low blood sugars. 3. Eat 45-60 g of carbs per meal. Be sure to have protein at all meals. 4. Prevent hypoglycemia. 5. Take meds as prescribed. 6. Keep liquid carb choice at bedside if BS is low. 7. Notify Dr. Fransico Him if BS are less than 70 mg/dl. 8. Exercise 30 minutes 5 days per week. 7. Get A1C to 7% or below in three months without hypoglycemia episodes.  Teaching Method Utilized:  Visual Auditory Hands on  Handouts given during visit include:  Meal Plan Card  My Plate  Diabetes Instructions.  Barriers to learning/adherence to lifestyle change: None  Demonstrated degree of understanding via:  Teach Back   Monitoring/Evaluation:  Dietary intake, exercise, meal planning, and body weight in 3 month(s).

## 2014-10-30 ENCOUNTER — Other Ambulatory Visit: Payer: Self-pay | Admitting: Nurse Practitioner

## 2014-10-30 ENCOUNTER — Other Ambulatory Visit: Payer: Self-pay | Admitting: Cardiology

## 2014-10-30 DIAGNOSIS — I878 Other specified disorders of veins: Secondary | ICD-10-CM

## 2014-11-04 ENCOUNTER — Ambulatory Visit
Admission: RE | Admit: 2014-11-04 | Discharge: 2014-11-04 | Disposition: A | Discharge status: Home / Self Care | Payer: Medicare Other | Source: Ambulatory Visit | Attending: Nurse Practitioner | Admitting: Nurse Practitioner

## 2014-11-04 DIAGNOSIS — I878 Other specified disorders of veins: Secondary | ICD-10-CM | POA: Insufficient documentation

## 2014-11-04 DIAGNOSIS — R600 Localized edema: Secondary | ICD-10-CM | POA: Insufficient documentation

## 2014-11-04 NOTE — Consult Note (Signed)
Chief Complaint: Leg swelling  Referring Physician(s): Dr. Delrae Rend, Ms Marcy Salvo, NP  History of Present Illness: Carl Miller is a 69 y.o. male presenting to Vascular and Interventional radiology clinic for evaluation of bilateral lower extremity swelling, and potentially chronic venous insufficiency as the etiology.  Carl Miller reports worsening bilateral lower extremity swelling, which has been present for greater than a year or 2. Originally his symptoms involved only the bilateral ankles, though now this has been progressing to involve his lower legs below the knee. He describes worse swelling at the end of the day, particularly when he has been on his feet during the day. He reports swelling is improved in the mornings after being recumbent in bed.  He has been wearing compression stockings to the level of the knee with good success in treating the symptoms. He does state that his symptoms are better when he wears the stockings. He only wears the stockings during the day, and not at night. He has been wearing his stockings since May or June of this year.  He has had a prior blister arise on the left anterior shin just above the ankle. This did not result in any significant bleeding. He was treated at the time that this blister arose in May with antibiotics, but he did not require any hospitalization. He has no knowledge of a prior lower extremity venous thrombus or pulmonary embolism. No prior lower extremity vascular surgery.  He has prior DVT studies which have been negative.  He reports that he has only recently initiated cardiology care.  Past Medical History  Diagnosis Date  . GERD (gastroesophageal reflux disease)   . Diabetes mellitus   . Hyperlipidemia   . Hypertension   . Palpitations   . Vision abnormalities   . Headache   . Worsening headaches 01/28/2014  . Diabetes mellitus due to underlying condition with diabetic polyneuropathy 01/28/2014     Past Surgical History  Procedure Laterality Date  . Left heart catheterization with coronary angiogram N/A 01/13/2014    Procedure: LEFT HEART CATHETERIZATION WITH CORONARY ANGIOGRAM;  Surgeon: Pamella Pert, MD;  Location: [facility] CATH LAB;  Service: Cardiovascular;  Laterality: N/A;  . Back surgery    . Dental surgery    . Lithotomy    . Shoulder surgery      Allergies: Tape  Medications: Prior to Admission medications   Medication Sig Start Date End Date Taking? Authorizing Provider  alendronate (FOSAMAX) 70 MG tablet Take 70 mg by mouth every Saturday. Take with a full glass of water on an empty stomach.   Yes Historical Provider, MD  amLODipine (NORVASC) 5 MG tablet Take 5 mg by mouth daily.   Yes Historical Provider, MD  aspirin EC 81 MG tablet Take 81 mg by mouth daily.   Yes Historical Provider, MD  carvedilol (COREG) 25 MG tablet Take 25 mg by mouth 2 (two) times daily with a meal.   Yes Historical Provider, MD  dorzolamide (TRUSOPT) 2 % ophthalmic solution Place 1 drop into both eyes 2 (two) times daily.   Yes Historical Provider, MD  gemfibrozil (LOPID) 600 MG tablet Take 600 mg by mouth 2 (two) times daily before a meal.   Yes Historical Provider, MD  ibuprofen (ADVIL,MOTRIN) 800 MG tablet Take 800 mg by mouth 3 (three) times daily as needed for mild pain.   Yes Historical Provider, MD  Icosapent Ethyl (VASCEPA) 1 G CAPS Take 1 g by mouth 2 (two) times  daily.   Yes Historical Provider, MD  Insulin Glargine (LANTUS SOLOSTAR) 100 UNIT/ML Solostar Pen Inject 20 Units into the skin daily at 10 pm.   Yes Historical Provider, MD  metFORMIN (GLUCOPHAGE) 1000 MG tablet Take 1 tablet (1,000 mg total) by mouth 2 (two) times daily with a meal. 01/15/14  Yes Yates Decamp, MD  omeprazole (PRILOSEC) 20 MG capsule Take 20 mg by mouth daily.   Yes Historical Provider, MD  pantoprazole (PROTONIX) 40 MG tablet  01/02/14  Yes Historical Provider, MD  spironolactone (ALDACTONE) 25 MG tablet   12/26/13  Yes Historical Provider, MD  valsartan (DIOVAN) 320 MG tablet Take 320 mg by mouth daily.   Yes Historical Provider, MD  Vitamin D, Ergocalciferol, (DRISDOL) 50000 UNITS CAPS capsule Take 50,000 Units by mouth every Saturday.   Yes Historical Provider, MD  atorvastatin (LIPITOR) 40 MG tablet Take 40 mg by mouth daily.    Historical Provider, MD  carvedilol (COREG) 12.5 MG tablet Take 12.5 mg by mouth 2 (two) times daily with a meal.    Historical Provider, MD  fenofibrate 160 MG tablet  11/14/13   Historical Provider, MD  furosemide (LASIX) 20 MG tablet Take 20 mg by mouth 2 (two) times daily.    Historical Provider, MD  glimepiride (AMARYL) 4 MG tablet  11/14/13   Historical Provider, MD  latanoprost (XALATAN) 0.005 % ophthalmic solution Place 1 drop into both eyes at bedtime.    Historical Provider, MD  Multiple Vitamin (MULTIVITAMIN WITH MINERALS) TABS tablet Take 1 tablet by mouth daily.    Historical Provider, MD  sitaGLIPtin (JANUVIA) 100 MG tablet Take 100 mg by mouth daily.    Historical Provider, MD  temazepam (RESTORIL) 30 MG capsule Take 30 mg by mouth at bedtime as needed for sleep.    Historical Provider, MD     Family History  Problem Relation Age of Onset  . Stroke Mother   . Diabetes Mother   . Heart attack Father     Social History   Social History  . Marital Status: Single    Spouse Name: N/A  . Number of Children: N/A  . Years of Education: N/A   Social History Main Topics  . Smoking status: Former Games developer  . Smokeless tobacco: Former Neurosurgeon    Quit date: 01/17/1997  . Alcohol Use: No     Comment: quit 12/2012  . Drug Use: No  . Sexual Activity: Not on file   Other Topics Concern  . Not on file   Social History Narrative   Caffeine 30 ounces/ daily  (2 diet Mtn Dew/ week. Retired, lives alone.  Divorced.      Review of Systems: A 12 point ROS discussed and pertinent positives are indicated in the HPI above.  All other systems are  negative.  Review of Systems  Vital Signs: BP 168/85 mmHg  Pulse 73  Temp(Src) 98 F (36.7 C) (Oral)  Resp 14  Ht 6' (1.829 m)  Wt 214 lb (97.07 kg)  BMI 29.02 kg/m2  SpO2 98%  Physical Exam  Atraumatic normocephalic mucous membranes moist and pink. Conjugate gaze. Wearing glasses. No scleral icterus or injection. Full range of motion of the cervical region. No adenopathy. Symmetric movement of the chest on inspiration and expiration with no labored breathing. Abdomen soft nontender no distention or muscle guarding. Genitourinary deferred. Moving all 4 extremities equally with no gross motor deficits. No gross sensory deficits. Exam of the lower extremities demonstrates mild edema of  the bilateral ankles extending to mid calf bilaterally. No wounds or excoriations. No significant lipodermatosclerosis. No enlarged varicosities are evident.  Mallampati Score:  2 Imaging:  Venous duplex of bilateral lower extremity performed on today's date. No DVT.  No venous insufficiency identified of the great saphenous vein on the left or right. No venous insufficiency of the small saphenous vein on the left or right. Duplex demonstrates bilateral lower extremity edema. Multiple varicosities/reticular veins are  present bilaterally. Single perforator identified on the right. Left small saphenous vein is a thigh extender, with no indication with the popliteal vein identified.  Labs:  CBC: No results for input(s): WBC, HGB, HCT, PLT in the last 8760 hours.  COAGS: No results for input(s): INR, APTT in the last 8760 hours.  BMP: No results for input(s): NA, K, CL, CO2, GLUCOSE, BUN, CALCIUM, CREATININE, GFRNONAA, GFRAA in the last 8760 hours.  Invalid input(s): CMP  LIVER FUNCTION TESTS: No results for input(s): BILITOT, AST, ALT, ALKPHOS, PROT, ALBUMIN in the last 8760 hours.  TUMOR MARKERS: No results for input(s): AFPTM, CEA, CA199, CHROMGRNA in the last 8760  hours.  Assessment and Plan:  Mr Christell ConstantMoore is a 69 year old gentleman presenting with bilateral lower extremity swelling, episodic, which has been worsening over time. 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     Although I agree that his history of a prior wound on the left lower extremity is suspicious for chronic venous insufficiency, the duplex performed on today's date demonstrates no evidence of venous insufficiency/reflux. He has a network of collateral/varicosities of the bilateral lower extremities, as well as a single perforator on the right identified, although these findings do not explain his symptoms of edema. At this time I feel that he is not a candidate for endovenous ablation of the saphenous system on either the right or the left.  I had a long discussion with Mr. Christell ConstantMoore regarding pathology/pathophysiology, anatomy, natural history, and potential treatment options of venous disease and his symptoms. I did express my feeling that his is multifactorial, with no component from venous reflux. I did emphasize that venous disease may progress, and that I would schedule a repeat duplex in one year if his symptoms do not significantly changed in the interval. If they progress more quickly, he may return for repeat evaluation and repeat duplex.  I discussed with him my conservative recommendations, which include continued knee-high compression stockings just as he is wearing. He agrees that these compression hose offer some relief and he will continue to wear them. I also encouraged him to observe appointments with his other physicians, in particular his cardiology appointments.  1 - Continue compression stocking therapy 2 - Repeat venous study in 1 year with reflux study, if not required sooner.   Thank you for this interesting consult.  I greatly enjoyed meeting Jerline PainClifton E Tesmer and look forward to participating in his care.  A copy of this report was sent to the requesting  provider on this date.  SignedGilmer Mor: Karrah Mangini 11/04/2014, 10:39 AM   I spent a total of  40 Minutes   in face to face in clinical consultation, greater than 50% of which was counseling/coordinating care for bilateral lower extremity edema, CEAP-3 disease, potential venous insufficiency.

## 2014-11-07 LAB — HEMOGLOBIN A1C: HEMOGLOBIN A1C: 6.4 % — AB (ref 4.0–6.0)

## 2014-11-17 ENCOUNTER — Encounter: Payer: Medicare Other | Attending: "Endocrinology | Admitting: Nutrition

## 2014-11-17 ENCOUNTER — Encounter: Payer: Self-pay | Admitting: Nutrition

## 2014-11-17 ENCOUNTER — Ambulatory Visit (INDEPENDENT_AMBULATORY_CARE_PROVIDER_SITE_OTHER): Payer: Medicare Other | Admitting: "Endocrinology

## 2014-11-17 ENCOUNTER — Encounter: Payer: Self-pay | Admitting: "Endocrinology

## 2014-11-17 VITALS — BP 169/70 | HR 69 | Ht 72.0 in | Wt 212.4 lb

## 2014-11-17 VITALS — Ht 72.0 in | Wt 212.0 lb

## 2014-11-17 DIAGNOSIS — E119 Type 2 diabetes mellitus without complications: Secondary | ICD-10-CM | POA: Diagnosis present

## 2014-11-17 DIAGNOSIS — E785 Hyperlipidemia, unspecified: Secondary | ICD-10-CM | POA: Diagnosis not present

## 2014-11-17 DIAGNOSIS — Z794 Long term (current) use of insulin: Secondary | ICD-10-CM | POA: Diagnosis not present

## 2014-11-17 DIAGNOSIS — Z713 Dietary counseling and surveillance: Secondary | ICD-10-CM | POA: Diagnosis not present

## 2014-11-17 DIAGNOSIS — I10 Essential (primary) hypertension: Secondary | ICD-10-CM | POA: Diagnosis not present

## 2014-11-17 DIAGNOSIS — E1122 Type 2 diabetes mellitus with diabetic chronic kidney disease: Secondary | ICD-10-CM | POA: Diagnosis not present

## 2014-11-17 DIAGNOSIS — E1165 Type 2 diabetes mellitus with hyperglycemia: Secondary | ICD-10-CM

## 2014-11-17 NOTE — Patient Instructions (Signed)

## 2014-11-17 NOTE — Progress Notes (Signed)
Subjective:    Patient ID: Carl Miller, male    DOB: 1945/03/09,    Past Medical History  Diagnosis Date  . GERD (gastroesophageal reflux disease)   . Diabetes mellitus (HCC)   . Hyperlipidemia   . Hypertension   . Palpitations   . Vision abnormalities   . Headache   . Worsening headaches 01/28/2014  . 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 Left heart catheterization with coronary angiogram N/A 01/13/2014    Procedure: LEFT HEART CATHETERIZATION WITH CORONARY ANGIOGRAM;  Surgeon: Pamella Pert, MD;  Location: [facility] CATH LAB;  Service: Cardiovascular;  Laterality: N/A;  . Back surgery    . Dental surgery    . Lithotomy    . Shoulder surgery     Social History   Social History  . Marital Status: Single    Spouse Name: N/A  . Number of Children: N/A  . Years of Education: N/A   Social History Main Topics  . Smoking status: Former Games developer  . Smokeless tobacco: Former Neurosurgeon    Quit date: 01/17/1997  . Alcohol Use: No     Comment: quit 12/2012  . Drug Use: No  . Sexual Activity: Not Asked   Other Topics Concern  . None   Social History Narrative   Caffeine 30 ounces/ daily  (2 diet Mtn Dew/ week. Retired, lives alone.  Divorced.    Outpatient Encounter Prescriptions as of 11/17/2014  Medication Sig  . alendronate (FOSAMAX) 70 MG tablet Take 70 mg by mouth every Saturday. Take with a full glass of water on an empty stomach.  Marland Kitchen aspirin EC 81 MG tablet Take 81 mg by mouth daily.  Marland Kitchen atorvastatin (LIPITOR) 40 MG tablet Take 40 mg by mouth daily.  . carvedilol (COREG) 25 MG tablet Take 25 mg by mouth 2 (two) times daily with a meal.  . dorzolamide (TRUSOPT) 2 % ophthalmic solution Place 1 drop into both eyes 2 (two) times daily.  Marland Kitchen ibuprofen (ADVIL,MOTRIN) 800 MG tablet Take 800 mg by mouth 3 (three) times daily as needed for mild pain.  Bess Harvest Ethyl  (VASCEPA) 1 G CAPS Take 1 g by mouth 2 (two) times daily.  . Insulin Glargine (LANTUS SOLOSTAR) 100 UNIT/ML Solostar Pen Inject 20 Units into the skin daily at 10 pm.  . metFORMIN (GLUCOPHAGE) 1000 MG tablet Take 1 tablet (1,000 mg total) by mouth 2 (two) times daily with a meal.  . Multiple Vitamin (MULTIVITAMIN WITH MINERALS) TABS tablet Take 1 tablet by mouth daily.  Marland Kitchen omeprazole (PRILOSEC) 20 MG capsule Take 20 mg by mouth daily.  . pantoprazole (PROTONIX) 40 MG tablet   . spironolactone (ALDACTONE) 25 MG tablet   . valsartan (DIOVAN) 320 MG tablet Take 320 mg by mouth daily.  . Vitamin D, Ergocalciferol, (DRISDOL) 50000 UNITS CAPS capsule Take 50,000 Units by mouth every Saturday.  . [DISCONTINUED] sitaGLIPtin (JANUVIA) 100 MG tablet Take 100 mg by mouth daily.  Marland Kitchen amLODipine (NORVASC) 5 MG tablet Take 5 mg by mouth daily.  . carvedilol (COREG) 12.5 MG tablet Take 12.5 mg by mouth 2 (two) times daily with a meal.  . fenofibrate 160 MG tablet   . furosemide (LASIX) 20 MG tablet Take 20 mg by mouth 2 (two) times daily.  Marland Kitchen gemfibrozil (LOPID) 600 MG tablet Take 600 mg by mouth 2 (two) times daily before a meal.  .  glimepiride (AMARYL) 4 MG tablet   . latanoprost (XALATAN) 0.005 % ophthalmic solution Place 1 drop into both eyes at bedtime.  . temazepam (RESTORIL) 30 MG capsule Take 30 mg by mouth at bedtime as needed for sleep.   No facility-administered encounter medications on file as of 11/17/2014.   ALLERGIES: Allergies  Allergen Reactions  . Tape Other (See Comments)    Latex tape - pulls skin off.  NO latex allergy.   VACCINATION STATUS:  There is no immunization history on file for this patient.  Diabetes He presents for his follow-up diabetic visit. He has type 2 diabetes mellitus. Onset time: He was diagnosed at approximate age of 47 years. His disease course has been improving. There are no hypoglycemic associated symptoms. Pertinent negatives for hypoglycemia include no  confusion, headaches, pallor or seizures. There are no diabetic associated symptoms. Pertinent negatives for diabetes include no chest pain, no fatigue, no polydipsia, no polyphagia, no polyuria and no weakness. Symptoms are improving. 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 Risk factors for coronary artery disease include diabetes mellitus, dyslipidemia, male sex, tobacco exposure and sedentary lifestyle. He is compliant with treatment most of the time. His weight is stable. He has had a previous visit with a dietitian. He rarely participates in exercise. His home blood glucose trend is decreasing steadily. His overall blood glucose range is 140-180 mg/dl. An ACE inhibitor/angiotensin II receptor blocker is being taken. Eye exam is current.  Hypertension This is a chronic problem. The current episode started more than 1 year ago. The problem is uncontrolled. Pertinent negatives include no chest pain, headaches, neck pain, palpitations or shortness of breath. Past treatments include angiotensin blockers. Hypertensive end-organ damage includes kidney disease.  Hyperlipidemia This is a chronic problem. The current episode started more than 1 year ago. Pertinent negatives include no chest pain, myalgias or shortness of breath. Current antihyperlipidemic treatment includes statins. Risk factors for coronary artery disease include male sex, dyslipidemia, diabetes mellitus and a sedentary lifestyle.     Review of Systems  Constitutional: Negative for fatigue and unexpected weight change.  HENT: Negative for dental problem, mouth sores and trouble swallowing.   Eyes: Negative for visual disturbance.  Respiratory: Negative for cough, choking, chest tightness, shortness of breath and wheezing.   Cardiovascular: Negative for chest pain, palpitations and leg swelling.  Gastrointestinal: Negative for nausea, vomiting, abdominal pain, diarrhea, constipation and abdominal distention.   Endocrine: Negative for polydipsia, polyphagia and polyuria.  Genitourinary: Negative for dysuria, urgency, hematuria and flank pain.  Musculoskeletal: Negative for myalgias, back pain, gait problem and neck pain.  Skin: Negative for pallor, rash and wound.  Neurological: Negative for seizures, syncope, weakness, numbness and headaches.  Psychiatric/Behavioral: Negative.  Negative for confusion and dysphoric mood.    Objective:    BP 169/70 mmHg  Pulse 69  Ht 6' (1.829 m)  Wt 212 lb 6.4 oz (96.344 kg)  BMI 28.80 kg/m2  SpO2 96%  Wt Readings from Last 3 Encounters:  11/17/14 212 lb 6.4 oz (96.344 kg)  11/17/14 212 lb (96.163 kg)  11/04/14 214 lb (97.07 kg)    Physical Exam  Constitutional: He is oriented to person, place, and time. He appears well-developed and well-nourished. He is cooperative. No distress.  HENT:  Head: Normocephalic and atraumatic.  Eyes: EOM are normal.  Neck: Normal range of motion. Neck supple. No tracheal deviation present. No thyromegaly present.  Cardiovascular: Normal rate, S1 normal, S2 normal and normal heart sounds.  Exam reveals  no gallop.   No murmur heard. Pulses:      Dorsalis pedis pulses are 1+ on the right side, and 1+ on the left side.       Posterior tibial pulses are 1+ on the right side, and 1+ on the left side.  Pulmonary/Chest: Breath sounds normal. No respiratory distress. He has no wheezes.  Abdominal: Soft. Bowel sounds are normal. He exhibits no distension. There is no tenderness. There is no guarding and no CVA tenderness.  Musculoskeletal: He exhibits no edema.       Right shoulder: He exhibits no swelling and no deformity.  Neurological: He is alert and oriented to person, place, and time. He has normal strength and normal reflexes. No cranial nerve deficit or sensory deficit. Gait normal.  Skin: Skin is warm and dry. No rash noted. No cyanosis. Nails show no clubbing.  Psychiatric: He has a normal mood and affect. His speech is  normal and behavior is normal. Judgment and thought content normal. Cognition and memory are normal.     His labs show A1c of 6.4% from 11/07/2014 improved from 12% in May 2016.   Assessment & Plan:   1. Uncontrolled type 2 diabetes mellitus with chronic kidney disease, with long-term current use of insulin, unspecified CKD stage (HCC) His urine microalbuminuria is significantly elevated at 3177.  His  diabetes is  complicated by  significant proteinuria possibly indicating nephropathy of multiple etiologies including diabetes and hypertension and patient remains at a high risk for more acute and chronic complications of diabetes which include CAD, CVA, CKD, retinopathy, and neuropathy. These are all discussed in detail with the patient.  Patient came with improved glucose profile, and  recent A1c of 6.4 %, generally improving from 12%.  Glucose logs and insulin administration records pertaining to this visit,  to be scanned into patient's records.  Recent labs reviewed.   - I have re-counseled the patient on diet management and weight loss  by adopting a carbohydrate restricted / protein rich  Diet.  - Suggestion is made for patient to avoid simple carbohydrates   from their diet including Cakes , Desserts, Ice Cream,  Soda (  diet and regular) , Sweet Tea , Candies,  Chips, Cookies, Artificial Sweeteners,   and "Sugar-free" Products .  This will help patient to have stable blood glucose profile and potentially avoid unintended  Weight gain.  - Patient is advised to stick to a routine mealtimes to eat 3 meals  a day and avoid unnecessary snacks ( to snack only to correct hypoglycemia).  - The patient  is  scheduled with Norm Salt, RDN, CDE for individualized DM education.  - I have approached patient with the following individualized plan to manage diabetes and patient agrees.   -Continue basal insulin Lantus 20 units qhs, associated with monitoring of BG BID.  He will not need  bolus insulin now.  Pt is advised not to inject insulin without proper monitoring of blood glucose.  -Patient is encouraged to call clinic for blood glucose levels less than 70 or above 300 mg /dl. -I will continue MTF 1gm po BID. -He will need referral to nephrology for workup of significant proteinuria. I advised him to discuss this with his primary medical doctor Dr. Andrey Campanile.   - Patient specific target  for A1c; LDL, HDL, Triglycerides, and  Waist Circumference were discussed in detail.  2) BP/HTN: Uncontrolled at 169/70. Continue current medications including ACEI/ARB. 3) Lipids/HPL:  continue statins. 4)  Weight/Diet: CDE consult in progress, exercise, and carbohydrates information provided.  5) Chronic Care/Health Maintenance:  -Patient is on ACEI/ARB and Statin medications and encouraged to continue to follow up with Ophthalmology, Podiatrist at least yearly or according to recommendations, and advised to  stay away from smoking. I have recommended yearly flu vaccine and pneumonia vaccination at least every 5 years; moderate intensity exercise for up to 150 minutes weekly; and  sleep for at least 7 hours a day.  I advised patient to maintain close follow up with their PCP for primary care needs.  Patient is asked to bring meter and  blood glucose logs during their next visit.   Follow up plan: Return in about 3 months (around 02/17/2015) for diabetes, high blood pressure, follow up with pre-visit labs, meter, and logs.  Marquis LunchGebre Gurtha Picker, MD Phone: [phone]  Fax: [phone]   11/17/2014, 9:47 AM

## 2014-11-17 NOTE — Progress Notes (Signed)
  Medical Nutrition Therapy:  Appt start time: 0900 end time:  0915.  Assessment:  Primary concerns today: DIabetes Type 2 DM, Follow up. A1C down to 6.4% from 7.7%. Lost 2 lbs Saw Dr .Fransico HimNida today. He is on 20 units of Lantus, 1000 mg of Metformin BID and Januvia 100 mg a day for his DM and Fenofibrate and Lipitor for his Hyperlipidiemia. Januvia has been discontinued. Lipid profile is excellent.  Changes Made: watching what he eats and eating more consistently. Had 2 episodes of low blood sugars due to not eating on time or in the middle of the night. Treated low blood sugar with juice at bedside. Walks some but has unstead gait. Has a cane but doesn't use it all the time. Risk of falls. Meter downloaded. BS higher in evening before bed time.   Wt Readings from Last 3 Encounters:  11/17/14 212 lb (96.163 kg)  11/04/14 214 lb (97.07 kg)  08/15/14 209 lb (94.802 kg)   Ht Readings from Last 3 Encounters:  11/17/14 6' (1.829 m)  11/04/14 6' (1.829 m)  08/15/14 6' (1.829 m)   Body mass index is 28.75 kg/(m^2).   Preferred Learning Style:     No preference indicated   Learning Readiness:   Ready  Change in progress  MEDICATIONS: See list   DIETARY INTAKE:  24-hr recall:  B ( AM): Rice krispies and 1% milk and banana OR  Oatmeal or eggs and   Snk ( AM): none  L ( PM):blackeyed peas/corn, turnip greens and bbq ribs, water Snk ( PM):  D ( PM):barley and vegetable souip 1 cup, 2 boiled egg whites, cucumbers and tomatoes and water Snk ( PM): none Beverages: water, coffee  Usual physical activity:  walks  Estimated energy needs: 2000-2200 calories 225 g carbohydrates 150 g protein 56 g fat  Progress Towards Goal(s):  In progress.   Nutritional Diagnosis:  NB-1.1 Food and nutrition-related knowledge deficit As related to DIabetes.  As evidenced by A1C  was 12%, now down to 7.7%..    Intervention:  Nutrition and diabetes education provided on diet, meal planning, portion  sizes, reading food labels, target ranges for blood sugars, hyper/hypoglycemia and treatment of both, monitoring BS, taking insulin properly and benefits of exercise to reduce complications.  Goals: KEEP UP THE GOOD WORK!! 1. Continue following My Plate 2. Eat three meals per day. 3. Eat 1 carb choice and some protein for snack if blood sugars is less than 120 before bed. 4. Keep A1C 6.4%.   Teaching Method Utilized:  Visual Auditory Hands on  Handouts given during visit include:  Meal Plan Card  My Plate  Diabetes Instructions.  Barriers to learning/adherence to lifestyle change: None  Demonstrated degree of understanding via:  Teach Back   Monitoring/Evaluation:  Dietary intake, exercise, meal planning, and body weight in 3 month(s).

## 2014-11-17 NOTE — Patient Instructions (Addendum)
Goals: KEEP UP THE GOOD WORK!! 1. Continue following My Plate 2. Eat three meals per day. 3. Eat 1 carb choice and some protein for snack if blood sugars is less than 120 before bed. 4. Keep A1C 6.4%.

## 2015-01-12 ENCOUNTER — Other Ambulatory Visit: Payer: Self-pay | Admitting: "Endocrinology

## 2015-02-11 ENCOUNTER — Other Ambulatory Visit: Payer: Self-pay | Admitting: "Endocrinology

## 2015-02-11 LAB — BASIC METABOLIC PANEL
BUN: 27 mg/dL — ABNORMAL HIGH (ref 7–25)
CALCIUM: 9.5 mg/dL (ref 8.6–10.3)
CO2: 28 mmol/L (ref 20–31)
CREATININE: 0.86 mg/dL (ref 0.70–1.18)
Chloride: 103 mmol/L (ref 98–110)
Glucose, Bld: 97 mg/dL (ref 65–99)
Potassium: 4.8 mmol/L (ref 3.5–5.3)
Sodium: 139 mmol/L (ref 135–146)

## 2015-02-11 LAB — HEMOGLOBIN A1C
Hgb A1c MFr Bld: 7 % — ABNORMAL HIGH (ref <5.7)
Mean Plasma Glucose: 154 mg/dL — ABNORMAL HIGH (ref <117)

## 2015-02-18 ENCOUNTER — Ambulatory Visit (INDEPENDENT_AMBULATORY_CARE_PROVIDER_SITE_OTHER): Payer: Medicare Other | Admitting: "Endocrinology

## 2015-02-18 ENCOUNTER — Encounter: Payer: Self-pay | Admitting: Nutrition

## 2015-02-18 ENCOUNTER — Encounter: Payer: Self-pay | Admitting: "Endocrinology

## 2015-02-18 ENCOUNTER — Encounter: Payer: Medicare Other | Attending: Family Medicine | Admitting: Nutrition

## 2015-02-18 VITALS — BP 132/74 | HR 76 | Ht 72.0 in | Wt 217.0 lb

## 2015-02-18 VITALS — Ht 72.0 in | Wt 217.0 lb

## 2015-02-18 DIAGNOSIS — Z794 Long term (current) use of insulin: Secondary | ICD-10-CM

## 2015-02-18 DIAGNOSIS — E1165 Type 2 diabetes mellitus with hyperglycemia: Secondary | ICD-10-CM | POA: Insufficient documentation

## 2015-02-18 DIAGNOSIS — E0842 Diabetes mellitus due to underlying condition with diabetic polyneuropathy: Secondary | ICD-10-CM | POA: Diagnosis not present

## 2015-02-18 DIAGNOSIS — E785 Hyperlipidemia, unspecified: Secondary | ICD-10-CM | POA: Diagnosis not present

## 2015-02-18 DIAGNOSIS — I10 Essential (primary) hypertension: Secondary | ICD-10-CM

## 2015-02-18 DIAGNOSIS — E1169 Type 2 diabetes mellitus with other specified complication: Secondary | ICD-10-CM | POA: Diagnosis present

## 2015-02-18 DIAGNOSIS — E118 Type 2 diabetes mellitus with unspecified complications: Secondary | ICD-10-CM

## 2015-02-18 DIAGNOSIS — IMO0002 Reserved for concepts with insufficient information to code with codable children: Secondary | ICD-10-CM

## 2015-02-18 DIAGNOSIS — E669 Obesity, unspecified: Secondary | ICD-10-CM

## 2015-02-18 MED ORDER — INSULIN GLARGINE 100 UNIT/ML SOLOSTAR PEN
PEN_INJECTOR | SUBCUTANEOUS | Status: DC
Start: 2015-02-18 — End: 2015-08-18

## 2015-02-18 NOTE — Progress Notes (Signed)
Subjective:    Patient ID: Carl Miller, male    DOB: 07-25-1945,    Past Medical History  Diagnosis Date  . GERD (gastroesophageal reflux disease)   . Diabetes mellitus (HCC)   . Hyperlipidemia   . Hypertension   . Palpitations   . Vision abnormalities   . Headache   . Worsening headaches 01/28/2014  . 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 Left heart catheterization with coronary angiogram N/A 01/13/2014    Procedure: LEFT HEART CATHETERIZATION WITH CORONARY ANGIOGRAM;  Surgeon: Pamella Pert, MD;  Location: [facility] CATH LAB;  Service: Cardiovascular;  Laterality: N/A;  . Back surgery    . Dental surgery    . Lithotomy    . Shoulder surgery     Social History   Social History  . Marital Status: Single    Spouse Name: N/A  . Number of Children: N/A  . Years of Education: N/A   Social History Main Topics  . Smoking status: Former Games developer  . Smokeless tobacco: Former Neurosurgeon    Quit date: 01/17/1997  . Alcohol Use: No     Comment: quit 12/2012  . Drug Use: No  . Sexual Activity: Not Asked   Other Topics Concern  . None   Social History Narrative   Caffeine 30 ounces/ daily  (2 diet Mtn Dew/ week. Retired, lives alone.  Divorced.    Outpatient Encounter Prescriptions as of 02/18/2015  Medication Sig  . Insulin Glargine (LANTUS SOLOSTAR) 100 UNIT/ML Solostar Pen INJECT 20 UNITS INTO THE SKIN AT BEDTIME  . metFORMIN (GLUCOPHAGE) 1000 MG tablet Take 1 tablet (1,000 mg total) by mouth 2 (two) times daily with a meal.  . [DISCONTINUED] LANTUS SOLOSTAR 100 UNIT/ML Solostar Pen INJECT 20 UNITS INTO THE SKIN AT BEDTIME  . alendronate (FOSAMAX) 70 MG tablet Take 70 mg by mouth every Saturday. Take with a full glass of water on an empty stomach.  Marland Kitchen amLODipine (NORVASC) 5 MG tablet Take 5 mg by mouth daily.  Marland Kitchen aspirin EC 81 MG tablet Take 81 mg by mouth daily.  Marland Kitchen  atorvastatin (LIPITOR) 40 MG tablet Take 40 mg by mouth daily.  . B-D ULTRAFINE III SHORT PEN 31G X 8 MM MISC USE 1 AS DIRECTED  . carvedilol (COREG) 12.5 MG tablet Take 12.5 mg by mouth 2 (two) times daily with a meal.  . carvedilol (COREG) 25 MG tablet Take 25 mg by mouth 2 (two) times daily with a meal.  . dorzolamide (TRUSOPT) 2 % ophthalmic solution Place 1 drop into both eyes 2 (two) times daily.  . fenofibrate 160 MG tablet   . furosemide (LASIX) 20 MG tablet Take 20 mg by mouth 2 (two) times daily.  Marland Kitchen gemfibrozil (LOPID) 600 MG tablet Take 600 mg by mouth 2 (two) times daily before a meal.  . ibuprofen (ADVIL,MOTRIN) 800 MG tablet Take 800 mg by mouth 3 (three) times daily as needed for mild pain.  Bess Harvest Ethyl (VASCEPA) 1 G CAPS Take 1 g by mouth 2 (two) times daily.  Marland Kitchen latanoprost (XALATAN) 0.005 % ophthalmic solution Place 1 drop into both eyes at bedtime.  . Multiple Vitamin (MULTIVITAMIN WITH MINERALS) TABS tablet Take 1 tablet by mouth daily.  Marland Kitchen omeprazole (PRILOSEC) 20 MG capsule Take 20 mg by mouth daily.  . pantoprazole (PROTONIX) 40 MG tablet   . spironolactone (ALDACTONE) 25 MG tablet   .  temazepam (RESTORIL) 30 MG capsule Take 30 mg by mouth at bedtime as needed for sleep.  . valsartan (DIOVAN) 320 MG tablet Take 320 mg by mouth daily.  . Vitamin D, Ergocalciferol, (DRISDOL) 50000 UNITS CAPS capsule Take 50,000 Units by mouth every Saturday.  . [DISCONTINUED] glimepiride (AMARYL) 4 MG tablet    No facility-administered encounter medications on file as of 02/18/2015.   ALLERGIES: Allergies  Allergen Reactions  . Tape Other (See Comments)    Latex tape - pulls skin off.  NO latex allergy.   VACCINATION STATUS:  There is no immunization history on file for this patient.  Diabetes He presents for his follow-up diabetic visit. He has type 2 diabetes mellitus. Onset time: He was diagnosed at approximate age of 47 years. His disease course has been stable. There are  no hypoglycemic associated symptoms. Pertinent negatives for hypoglycemia include no confusion, headaches, pallor or seizures. There are no diabetic associated symptoms. Pertinent negatives for diabetes include no chest pain, no fatigue, no polydipsia, no polyphagia, no polyuria and no weakness. Symptoms are stable. 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 Risk factors for coronary artery disease include diabetes mellitus, dyslipidemia, male sex, tobacco exposure and sedentary lifestyle. He is compliant with treatment most of the time. His weight is stable. He is following a generally unhealthy diet. He has had a previous visit with a dietitian. He rarely participates in exercise. His home blood glucose trend is decreasing steadily. His overall blood glucose range is 140-180 mg/dl. An ACE inhibitor/angiotensin II receptor blocker is being taken. Eye exam is current.  Hypertension This is a chronic problem. The current episode started more than 1 year ago. The problem is uncontrolled. Pertinent negatives include no chest pain, headaches, neck pain, palpitations or shortness of breath. Past treatments include angiotensin blockers. Hypertensive end-organ damage includes kidney disease.  Hyperlipidemia This is a chronic problem. The current episode started more than 1 year ago. Pertinent negatives include no chest pain, myalgias or shortness of breath. Current antihyperlipidemic treatment includes statins. Risk factors for coronary artery disease include male sex, dyslipidemia, diabetes mellitus and a sedentary lifestyle.     Review of Systems  Constitutional: Negative for fatigue and unexpected weight change.  HENT: Negative for dental problem, mouth sores and trouble swallowing.   Eyes: Negative for visual disturbance.  Respiratory: Negative for cough, choking, chest tightness, shortness of breath and wheezing.   Cardiovascular: Negative for chest pain, palpitations and leg  swelling.  Gastrointestinal: Negative for nausea, vomiting, abdominal pain, diarrhea, constipation and abdominal distention.  Endocrine: Negative for polydipsia, polyphagia and polyuria.  Genitourinary: Negative for dysuria, urgency, hematuria and flank pain.  Musculoskeletal: Negative for myalgias, back pain, gait problem and neck pain.  Skin: Negative for pallor, rash and wound.  Neurological: Negative for seizures, syncope, weakness, numbness and headaches.  Psychiatric/Behavioral: Negative.  Negative for confusion and dysphoric mood.    Objective:    BP 132/74 mmHg  Pulse 76  Ht 6' (1.829 m)  Wt 217 lb (98.431 kg)  BMI 29.42 kg/m2  SpO2 97%  Wt Readings from Last 3 Encounters:  02/18/15 217 lb (98.431 kg)  11/17/14 212 lb 6.4 oz (96.344 kg)  11/17/14 212 lb (96.163 kg)    Physical Exam  Constitutional: He is oriented to person, place, and time. He appears well-developed and well-nourished. He is cooperative. No distress.  HENT:  Head: Normocephalic and atraumatic.  Eyes: EOM are normal.  Neck: Normal range of motion. Neck supple. No tracheal  deviation present. No thyromegaly present.  Cardiovascular: Normal rate, S1 normal, S2 normal and normal heart sounds.  Exam reveals no gallop.   No murmur heard. Pulses:      Dorsalis pedis pulses are 1+ on the right side, and 1+ on the left side.       Posterior tibial pulses are 1+ on the right side, and 1+ on the left side.  Pulmonary/Chest: Breath sounds normal. No respiratory distress. He has no wheezes.  Abdominal: Soft. Bowel sounds are normal. He exhibits no distension. There is no tenderness. There is no guarding and no CVA tenderness.  Musculoskeletal: He exhibits no edema.       Right shoulder: He exhibits no swelling and no deformity.  Neurological: He is alert and oriented to person, place, and time. He has normal strength and normal reflexes. No cranial nerve deficit or sensory deficit. Gait normal.  Skin: Skin is warm  and dry. No rash noted. No cyanosis. Nails show no clubbing.  Psychiatric: He has a normal mood and affect. His speech is normal and behavior is normal. Judgment and thought content normal. Cognition and memory are normal.     His labs show A1c of 6.4% from 11/07/2014 improved from 12% in May 2016.   Assessment & Plan:   1. Uncontrolled type 2 diabetes mellitus with chronic kidney disease, with long-term current use of insulin, unspecified CKD stage (HCC)  His  diabetes is  complicated by  significant proteinuria possibly indicating nephropathy of multiple etiologies including diabetes and hypertension and patient remains at a high risk for more acute and chronic complications of diabetes which include CAD, CVA, CKD, retinopathy, and neuropathy. These are all discussed in detail with the patient.  Patient came with improved glucose profile, and  recent A1c increased to 7% from 6.4 %, generally improving from 12%.  Glucose logs and insulin administration records pertaining to this visit,  to be scanned into patient's records.  Recent labs reviewed.   - I have re-counseled the patient on diet management and weight loss  by adopting a carbohydrate restricted / protein rich  Diet.  - Suggestion is made for patient to avoid simple carbohydrates   from their diet including Cakes , Desserts, Ice Cream,  Soda (  diet and regular) , Sweet Tea , Candies,  Chips, Cookies, Artificial Sweeteners,   and "Sugar-free" Products .  This will help patient to have stable blood glucose profile and potentially avoid unintended  Weight gain.  - Patient is advised to stick to a routine mealtimes to eat 3 meals  a day and avoid unnecessary snacks ( to snack only to correct hypoglycemia).  - The patient  is  scheduled with Norm Salt, RDN, CDE for individualized DM education.  - I have approached patient with the following individualized plan to manage diabetes and patient agrees.   -Continue basal insulin  Lantus 20 units qhs, associated with monitoring of BG BID. I advised him to avoid unnecessary snacks in the evening.  He will not need bolus insulin now.  Pt is advised not to inject insulin without proper monitoring of blood glucose.  -Patient is encouraged to call clinic for blood glucose levels less than 70 or above 300 mg /dl. -I will continue MTF 1gm po BID. -He is status post evaluation by  nephrology for workup of significant proteinuria. I advised him to stay close follow up with the nephrologist. - Patient specific target  for A1c; LDL, HDL, Triglycerides, and  Waist  Circumference were discussed in detail.  2) BP/HTN: Uncontrolled at 169/70. Continue current medications including ACEI/ARB. 3) Lipids/HPL:  continue statins. 4)  Weight/Diet: CDE consult in progress, exercise, and carbohydrates information provided.  5) Chronic Care/Health Maintenance:  -Patient is on ACEI/ARB and Statin medications and encouraged to continue to follow up with Ophthalmology, Podiatrist at least yearly or according to recommendations, and advised to  stay away from smoking. I have recommended yearly flu vaccine and pneumonia vaccination at least every 5 years; moderate intensity exercise for up to 150 minutes weekly; and  sleep for at least 7 hours a day.  I advised patient to maintain close follow up with their PCP for primary care needs.  Patient is asked to bring meter and  blood glucose logs during their next visit.   Follow up plan: Return in about 3 months (around 05/18/2015) for diabetes, high blood pressure, high cholesterol, follow up with pre-visit labs, meter, and logs.  Marquis Lunch, MD Phone: [phone]  Fax: [phone]   02/18/2015, 12:06 PM

## 2015-02-18 NOTE — Progress Notes (Signed)
  Medical Nutrition Therapy:  Appt start time: 0830 end time:  0900.   Assessment:  Primary concerns today: Follow up DM. A1C 7%. BS elevated at night before going to bed. Has been snacking on cereal before bed. 20 units Lantus.  2 g Metformin daily. Downloaded meter.  Lab Results  Component Value Date   HGBA1C 7.0* 02/11/2015    Preferred Learning Style:     No preference indicated   Learning Readiness:   Ready  Change in progress   MEDICATIONS:    DIETARY INTAKE:  24-hr recall:  B ( AM): Boiled ham sandwich and small banana, coffee water Snk ( AM): none L ( PM):Chili beans,chicken, 6 crackers, coffee and water Snk ( PM):  D ( PM): Can chiicken, asparagus and 1/2 sweet potato, water, 1 slice bread Snk ( PM): veggies-1 cup special K, Beverages:   Usual physical activity: Walks some but not good balance and leg issues.  Estimated energy needs: 1800 calories 200 g carbohydrates 120 g protein 44 g fat  Progress Towards Goal(s):  In progress.   Nutritional Diagnosis:  NB-1.1 Food and nutrition-related knowledge deficit As related to DM.  As evidenced by A1C 7%.    Intervention:  Nutrition and Diabetes education provided on My Plate, CHO counting, meal planning, portion sizes, timing of meals, avoiding snacks between meals unless having a low blood sugar, target ranges for A1C and blood sugars, signs/symptoms and treatment of hyper/hypoglycemia, monitoring blood sugars, taking medications as prescribed, benefits of exercising 30 minutes per day and prevention of complications of DM. Meal planning, reviewed CHO counting and portion control and avoiding snacks at night.    Goals 1. Follow Plate Method 2. Drink 5 bottles of water per day 3. Cut out night time snacks 4. Increase walking to 30 minutes day 5. Get BS less than 150 before bedtime. 6. Increase low carb vegetables at night.  Teaching Method Utilized:  Visual Auditory Hands on  Handouts given during  visit include:  The Plate Method    Barriers to learning/adherence to lifestyle change: None  Demonstrated degree of understanding via:  Teach Back   Monitoring/Evaluation:  Dietary intake, exercise, meal planning, SBG, and body weight in 3 month(s).

## 2015-02-18 NOTE — Patient Instructions (Signed)
Goals 1. Follow Plate Method 2. Drink 5 bottles of water per day 3. Cut out night time snacks 4. Increase walking to 30 minutes day 5. Get BS less than 150 before bedtime. 6. Increase low carb vegetables at night.

## 2015-02-18 NOTE — Patient Instructions (Signed)

## 2015-05-05 DIAGNOSIS — F4321 Adjustment disorder with depressed mood: Secondary | ICD-10-CM | POA: Insufficient documentation

## 2015-05-05 DIAGNOSIS — I878 Other specified disorders of veins: Secondary | ICD-10-CM | POA: Insufficient documentation

## 2015-05-05 DIAGNOSIS — G43909 Migraine, unspecified, not intractable, without status migrainosus: Secondary | ICD-10-CM | POA: Insufficient documentation

## 2015-05-05 DIAGNOSIS — M1990 Unspecified osteoarthritis, unspecified site: Secondary | ICD-10-CM | POA: Insufficient documentation

## 2015-05-05 DIAGNOSIS — H409 Unspecified glaucoma: Secondary | ICD-10-CM | POA: Insufficient documentation

## 2015-05-05 DIAGNOSIS — H9190 Unspecified hearing loss, unspecified ear: Secondary | ICD-10-CM | POA: Insufficient documentation

## 2015-05-05 DIAGNOSIS — H7013 Chronic mastoiditis, bilateral: Secondary | ICD-10-CM | POA: Insufficient documentation

## 2015-05-05 DIAGNOSIS — M6281 Muscle weakness (generalized): Secondary | ICD-10-CM | POA: Insufficient documentation

## 2015-05-05 DIAGNOSIS — I10 Essential (primary) hypertension: Secondary | ICD-10-CM | POA: Insufficient documentation

## 2015-05-05 DIAGNOSIS — K219 Gastro-esophageal reflux disease without esophagitis: Secondary | ICD-10-CM | POA: Insufficient documentation

## 2015-05-07 ENCOUNTER — Other Ambulatory Visit: Payer: Self-pay | Admitting: "Endocrinology

## 2015-05-07 LAB — BASIC METABOLIC PANEL
BUN: 27 mg/dL — ABNORMAL HIGH (ref 7–25)
CALCIUM: 8.9 mg/dL (ref 8.6–10.3)
CO2: 26 mmol/L (ref 20–31)
Chloride: 105 mmol/L (ref 98–110)
Creat: 1.49 mg/dL — ABNORMAL HIGH (ref 0.70–1.18)
Glucose, Bld: 124 mg/dL — ABNORMAL HIGH (ref 65–99)
POTASSIUM: 4.8 mmol/L (ref 3.5–5.3)
SODIUM: 140 mmol/L (ref 135–146)

## 2015-05-07 LAB — HEMOGLOBIN A1C
HEMOGLOBIN A1C: 7.6 % — AB (ref <5.7)
MEAN PLASMA GLUCOSE: 171 mg/dL

## 2015-05-18 ENCOUNTER — Encounter: Payer: Self-pay | Admitting: "Endocrinology

## 2015-05-18 ENCOUNTER — Encounter: Payer: Medicare Other | Attending: Family Medicine | Admitting: Nutrition

## 2015-05-18 ENCOUNTER — Ambulatory Visit (INDEPENDENT_AMBULATORY_CARE_PROVIDER_SITE_OTHER): Payer: Medicare Other | Admitting: "Endocrinology

## 2015-05-18 VITALS — Ht 72.0 in | Wt 214.0 lb

## 2015-05-18 VITALS — BP 151/66 | HR 72 | Ht 72.0 in | Wt 214.0 lb

## 2015-05-18 DIAGNOSIS — Z794 Long term (current) use of insulin: Secondary | ICD-10-CM | POA: Diagnosis not present

## 2015-05-18 DIAGNOSIS — E785 Hyperlipidemia, unspecified: Secondary | ICD-10-CM | POA: Diagnosis not present

## 2015-05-18 DIAGNOSIS — IMO0002 Reserved for concepts with insufficient information to code with codable children: Secondary | ICD-10-CM

## 2015-05-18 DIAGNOSIS — I10 Essential (primary) hypertension: Secondary | ICD-10-CM | POA: Diagnosis not present

## 2015-05-18 DIAGNOSIS — E0842 Diabetes mellitus due to underlying condition with diabetic polyneuropathy: Secondary | ICD-10-CM

## 2015-05-18 DIAGNOSIS — E1165 Type 2 diabetes mellitus with hyperglycemia: Secondary | ICD-10-CM | POA: Insufficient documentation

## 2015-05-18 DIAGNOSIS — E118 Type 2 diabetes mellitus with unspecified complications: Secondary | ICD-10-CM

## 2015-05-18 NOTE — Patient Instructions (Signed)

## 2015-05-18 NOTE — Progress Notes (Signed)
  Medical Nutrition Therapy:  Appt start time: 0830 end time:  0900.  Assessment:  Primary concerns today: Follow up DM. A1C 7.6%, up just a little from 7%. Creatine up to 1.46 and BUN elevated. Saw. Dr. Fransico HimNIda today and he reduced his Metformin to 500 mg BID and his other MD reduced his Triglyceride meds also. Feel better overall because he is eating better quality foods and cutting out the processed, junk food and fried foods. Weight loss of 2 lbs. He will be working on not eating late at night . Taking 20 units Lantus.  Diet recall reveals he is eating 2-3 carb choices per meal and avoiding snacks between meals. He admits to drinking mostly coffee and willing to increase water and cut back on coffee. Diet is improving nicely.  Lab Results  Component Value Date   HGBA1C 7.6* 05/07/2015    Preferred Learning Style:     No preference indicated   Learning Readiness:   Ready  Change in progress   MEDICATIONS:    DIETARY INTAKE:  24-hr recall:  B ( AM): Egg beaters, small banana and piece of toast, water or coffee Snk ( AM): none L ( PM): Snk ( PM): corn beef, broccoli, cauliflower and onions, water, apple D ( PM): Meat and various veggies, water Snk ( PM):  Beverages: coffee and water  Usual physical activity: Walks some but not good balance and leg issues.  Estimated energy needs: 1800 calories 200 g carbohydrates 120 g protein 44 g fat  Progress Towards Goal(s):  In progress.   Nutritional Diagnosis:  NB-1.1 Food and nutrition-related knowledge deficit As related to DM.  As evidenced by A1C 7%.    Intervention:  Nutrition and Diabetes education provided on My Plate, CHO counting, meal planning, portion sizes, timing of meals, avoiding snacks between meals unless having a low blood sugar, target ranges for A1C and blood sugars, signs/symptoms and treatment of hyper/hypoglycemia, monitoring blood sugars, taking medications as prescribed, benefits of exercising 30 minutes  per day and prevention of complications of DM. Meal planning, reviewed CHO counting and portion control and avoiding snacks at night.    Goals 1. Increase water to  5-6 big glasses of water daily. 2. Increase green veggies 3. Increase walking daily. 4. Avoid salty foods: read food labels.5 Get A1C down to 7% in three months.  Teaching Method Utilized:  Visual Auditory Hands on  Handouts given during visit include:  The Plate Method    Barriers to learning/adherence to lifestyle change: None  Demonstrated degree of understanding via:  Teach Back   Monitoring/Evaluation:  Dietary intake, exercise, meal planning, SBG, and body weight in 3 month(s).

## 2015-05-18 NOTE — Patient Instructions (Signed)
Goals 1. Increase water to  5-6 big glasses of water daily. 2. Increase green veggies 3. Increase walking daily. 4. Avoid salty foods: read food labels.5 Get A1C down to 7% in three months.

## 2015-05-18 NOTE — Progress Notes (Signed)
Subjective:    Patient ID: Carl PainClifton E Miller, male    DOB: 07/30/1945,    Past Medical History  Diagnosis Date  . GERD (gastroesophageal reflux disease)   . Diabetes mellitus (HCC)   . Hyperlipidemia   . Hypertension   . Palpitations   . Vision abnormalities   . Headache   . Worsening headaches 01/28/2014  . 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 Left heart catheterization with coronary angiogram N/A 01/13/2014    Procedure: LEFT HEART CATHETERIZATION WITH CORONARY ANGIOGRAM;  Surgeon: Carl PertJagadeesh R Ganji, MD;  Location: [facility] CATH LAB;  Service: Cardiovascular;  Laterality: N/A;  . Back surgery    . Dental surgery    . Lithotomy    . Shoulder surgery     Social History   Social History  . Marital Status: Single    Spouse Name: N/A  . Number of Children: N/A  . Years of Education: N/A   Social History Main Topics  . Smoking status: Former Games developermoker  . Smokeless tobacco: Former NeurosurgeonUser    Quit date: 01/17/1997  . Alcohol Use: No     Comment: quit 12/2012  . Drug Use: No  . Sexual Activity: Not Asked   Other Topics Concern  . None   Social History Narrative   Caffeine 30 ounces/ daily  (2 diet Mtn Dew/ week. Retired, lives alone.  Divorced.    Outpatient Encounter Prescriptions as of 05/18/2015  Medication Sig  . metFORMIN (GLUCOPHAGE) 500 MG tablet Take 500 mg by mouth 2 (two) times daily with a meal.  . alendronate (FOSAMAX) 70 MG tablet Take 70 mg by mouth every Saturday. Take with a full glass of water on an empty stomach.  Marland Kitchen. amLODipine (NORVASC) 5 MG tablet Take 5 mg by mouth daily.  Marland Kitchen. aspirin EC 81 MG tablet Take 81 mg by mouth daily.  Marland Kitchen. atorvastatin (LIPITOR) 40 MG tablet Take 40 mg by mouth daily.  . B-D ULTRAFINE III SHORT PEN 31G X 8 MM MISC USE 1 AS DIRECTED  . carvedilol (COREG) 12.5 MG tablet Take 12.5 mg by mouth 2 (two) times daily with a meal.  .  carvedilol (COREG) 25 MG tablet Take 25 mg by mouth 2 (two) times daily with a meal.  . dorzolamide (TRUSOPT) 2 % ophthalmic solution Place 1 drop into both eyes 2 (two) times daily.  . fenofibrate 160 MG tablet   . furosemide (LASIX) 20 MG tablet Take 20 mg by mouth 2 (two) times daily.  Marland Kitchen. gemfibrozil (LOPID) 600 MG tablet Take 600 mg by mouth 2 (two) times daily before a meal.  . ibuprofen (ADVIL,MOTRIN) 800 MG tablet Take 800 mg by mouth 3 (three) times daily as needed for mild Miller.  Bess Harvest. Icosapent Ethyl (VASCEPA) 1 G CAPS Take 1 g by mouth 2 (two) times daily.  . Insulin Glargine (LANTUS SOLOSTAR) 100 UNIT/ML Solostar Pen INJECT 20 UNITS INTO THE SKIN AT BEDTIME  . latanoprost (XALATAN) 0.005 % ophthalmic solution Place 1 drop into both eyes at bedtime.  . Multiple Vitamin (MULTIVITAMIN WITH MINERALS) TABS tablet Take 1 tablet by mouth daily.  Marland Kitchen. omeprazole (PRILOSEC) 20 MG capsule Take 20 mg by mouth daily.  . pantoprazole (PROTONIX) 40 MG tablet   . spironolactone (ALDACTONE) 25 MG tablet   . temazepam (RESTORIL) 30 MG capsule Take 30 mg by mouth at bedtime as needed for sleep.  .Marland Kitchen  valsartan (DIOVAN) 320 MG tablet Take 320 mg by mouth daily.  . Vitamin D, Ergocalciferol, (DRISDOL) 50000 UNITS CAPS capsule Take 50,000 Units by mouth every Saturday.  . [DISCONTINUED] metFORMIN (GLUCOPHAGE) 1000 MG tablet Take 1 tablet (1,000 mg total) by mouth 2 (two) times daily with a meal.   No facility-administered encounter medications on file as of 05/18/2015.   ALLERGIES: Allergies  Allergen Reactions  . Tape Other (See Comments)    Latex tape - pulls skin off.  NO latex allergy.   VACCINATION STATUS:  There is no immunization history on file for this patient.  Diabetes He presents for his follow-up diabetic visit. He has type 2 diabetes mellitus. Onset time: He was diagnosed at approximate age of 47 years. His disease course has been worsening. There are no hypoglycemic associated symptoms.  Pertinent negatives for hypoglycemia include no confusion, headaches, pallor or seizures. There are no diabetic associated symptoms. Pertinent negatives for diabetes include no chest Miller, no fatigue, no polydipsia, no polyphagia, no polyuria and no weakness. Symptoms are worsening. 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 Risk factors for coronary artery disease include diabetes mellitus, dyslipidemia, male sex, tobacco exposure and sedentary lifestyle. He is compliant with treatment most of the time. His weight is stable. He is following a generally unhealthy diet. He has had a previous visit with a dietitian. He rarely participates in exercise. His home blood glucose trend is decreasing steadily. His breakfast blood glucose range is generally 130-140 mg/dl. His highest blood glucose is 180-200 mg/dl. His overall blood glucose range is 140-180 mg/dl. An ACE inhibitor/angiotensin II receptor blocker is being taken. Eye exam is current.  Hypertension This is a chronic problem. The current episode started more than 1 year ago. The problem is uncontrolled. Pertinent negatives include no chest Miller, headaches, neck Miller, palpitations or shortness of breath. Past treatments include angiotensin blockers. Hypertensive end-organ damage includes kidney disease.  Hyperlipidemia This is a chronic problem. The current episode started more than 1 year ago. Pertinent negatives include no chest Miller, myalgias or shortness of breath. Current antihyperlipidemic treatment includes statins. Risk factors for coronary artery disease include male sex, dyslipidemia, diabetes mellitus and a sedentary lifestyle.     Review of Systems  Constitutional: Negative for fatigue and unexpected weight change.  HENT: Negative for dental problem, mouth sores and trouble swallowing.   Eyes: Negative for visual disturbance.  Respiratory: Negative for cough, choking, chest tightness, shortness of breath and  wheezing.   Cardiovascular: Negative for chest Miller, palpitations and leg swelling.  Gastrointestinal: Negative for nausea, vomiting, abdominal Miller, diarrhea, constipation and abdominal distention.  Endocrine: Negative for polydipsia, polyphagia and polyuria.  Genitourinary: Negative for dysuria, urgency, hematuria and flank Miller.  Musculoskeletal: Negative for myalgias, back Miller, gait problem and neck Miller.  Skin: Negative for pallor, rash and wound.  Neurological: Negative for seizures, syncope, weakness, numbness and headaches.  Psychiatric/Behavioral: Negative.  Negative for confusion and dysphoric mood.    Objective:    BP 151/66 mmHg  Pulse 72  Ht 6' (1.829 m)  Wt 214 lb (97.07 kg)  BMI 29.02 kg/m2  SpO2 95%  Wt Readings from Last 3 Encounters:  05/18/15 214 lb (97.07 kg)  02/18/15 217 lb (98.431 kg)  02/18/15 217 lb (98.431 kg)    Physical Exam  Constitutional: He is oriented to person, place, and time. He appears well-developed and well-nourished. He is cooperative. No distress.  HENT:  Head: Normocephalic and atraumatic.  Eyes: EOM are normal.  Neck: Normal range of motion. Neck supple. No tracheal deviation present. No thyromegaly present.  Cardiovascular: Normal rate, S1 normal, S2 normal and normal heart sounds.  Exam reveals no gallop.   No murmur heard. Pulses:      Dorsalis pedis pulses are 1+ on the right side, and 1+ on the left side.       Posterior tibial pulses are 1+ on the right side, and 1+ on the left side.  Pulmonary/Chest: Breath sounds normal. No respiratory distress. He has no wheezes.  Abdominal: Soft. Bowel sounds are normal. He exhibits no distension. There is no tenderness. There is no guarding and no CVA tenderness.  Musculoskeletal: He exhibits no edema.       Right shoulder: He exhibits no swelling and no deformity.  Neurological: He is alert and oriented to person, place, and time. He has normal strength and normal reflexes. No cranial  nerve deficit or sensory deficit. Gait normal.  Skin: Skin is warm and dry. No rash noted. No cyanosis. Nails show no clubbing.  Psychiatric: He has a normal mood and affect. His speech is normal and behavior is normal. Judgment and thought content normal. Cognition and memory are normal.     Recent Results (from the past 2160 hour(s))  Basic metabolic panel     Status: Abnormal   Collection Time: 05/07/15  7:49 AM  Result Value Ref Range   Sodium 140 135 - 146 mmol/L   Potassium 4.8 3.5 - 5.3 mmol/L   Chloride 105 98 - 110 mmol/L   CO2 26 20 - 31 mmol/L   Glucose, Bld 124 (H) 65 - 99 mg/dL   BUN 27 (H) 7 - 25 mg/dL   Creat 1.61 (H) 0.96 - 1.18 mg/dL   Calcium 8.9 8.6 - 04.5 mg/dL  Hemoglobin W0J     Status: Abnormal   Collection Time: 05/07/15  7:49 AM  Result Value Ref Range   Hgb A1c MFr Bld 7.6 (H) <5.7 %    Comment:   For someone without known diabetes, a hemoglobin A1c value of 6.5% or greater indicates that they may have diabetes and this should be confirmed with a follow-up test.   For someone with known diabetes, a value <7% indicates that their diabetes is well controlled and a value greater than or equal to 7% indicates suboptimal control. A1c targets should be individualized based on duration of diabetes, age, comorbid conditions, and other considerations.   Currently, no consensus exists for use of hemoglobin A1c for diagnosis of diabetes for children.      Mean Plasma Glucose 171 mg/dL      Assessment & Plan:   1. Uncontrolled type 2 diabetes mellitus with chronic kidney disease, with long-term current use of insulin, unspecified CKD stage (HCC)  His  diabetes is  complicated by  significant proteinuria possibly indicating nephropathy of multiple etiologies including diabetes and hypertension and patient remains at a high risk for more acute and chronic complications of diabetes which include CAD, CVA, CKD, retinopathy, and neuropathy. These are all  discussed in detail with the patient.  Patient came with improved glucose profile, and  recent A1c increased to 7.6% from 6.4 %,  After generally improving from 12%.  Glucose logs and insulin administration records pertaining to this visit,  to be scanned into patient's records.  Recent labs reviewed.   - I have re-counseled the patient on diet management and weight loss  by adopting a carbohydrate restricted / protein rich  Diet.  -  Suggestion is made for patient to avoid simple carbohydrates   from their diet including Cakes , Desserts, Ice Cream,  Soda (  diet and regular) , Sweet Tea , Candies,  Chips, Cookies, Artificial Sweeteners,   and "Sugar-free" Products .  This will help patient to have stable blood glucose profile and potentially avoid unintended  Weight gain.  - Patient is advised to stick to a routine mealtimes to eat 3 meals  a day and avoid unnecessary snacks ( to snack only to correct hypoglycemia).  - The patient  is  scheduled with Norm Salt, RDN, CDE for individualized DM education.  - I have approached patient with the following individualized plan to manage diabetes and patient agrees.  - Because of loss of control, he will need more insulin therapy. -It could be premixed insulin twice a day with breakfast and supper or basal/bolus insulin. However patient is hesitant to increase insulin therapy.  - Until his next visit I advised him to continue  basal insulin Lantus 30 units qhs, associated with monitoring of BG BID. I advised him to avoid unnecessary snacks in the evening.  -Patient is encouraged to call clinic for blood glucose levels less than 70 or above 300 mg /dl. -I will lower  MTF  To 500mg  po BID due to rising serum creatinine. -His A1c remains elevated, he will be reapproached for more intensive insulin placement -He is status post evaluation by  nephrology for workup of significant proteinuria. I advised him to stay close follow up with the  nephrologist. - Patient specific target  for A1c; LDL, HDL, Triglycerides, and  Waist Circumference were discussed in detail.  2) BP/HTN: Uncontrolled , but better at 151/76 . Continue current medications including ACEI/ARB. 3) Lipids/HPL:  continue statins. 4)  Weight/Diet: CDE consult in progress, exercise, and carbohydrates information provided.  5) Chronic Care/Health Maintenance:  -Patient is on ACEI/ARB and Statin medications and encouraged to continue to follow up with Ophthalmology, Podiatrist at least yearly or according to recommendations, and advised to  stay away from smoking. I have recommended yearly flu vaccine and pneumonia vaccination at least every 5 years; moderate intensity exercise for up to 150 minutes weekly; and  sleep for at least 7 hours a day.  I advised patient to maintain close follow up with their PCP for primary care needs.  Patient is asked to bring meter and  blood glucose logs during their next visit.   Follow up plan: Return in about 3 months (around 08/18/2015) for diabetes, high blood pressure, high cholesterol, follow up with pre-visit labs, meter, and logs.  Marquis Lunch, MD Phone: [phone]  Fax: [phone]   05/18/2015, 10:36 AM

## 2015-05-25 ENCOUNTER — Ambulatory Visit: Payer: Medicare Other | Admitting: Neurology

## 2015-06-04 ENCOUNTER — Other Ambulatory Visit: Payer: Self-pay | Admitting: "Endocrinology

## 2015-06-04 DIAGNOSIS — E1165 Type 2 diabetes mellitus with hyperglycemia: Secondary | ICD-10-CM

## 2015-06-04 DIAGNOSIS — E1169 Type 2 diabetes mellitus with other specified complication: Principal | ICD-10-CM

## 2015-06-04 DIAGNOSIS — IMO0002 Reserved for concepts with insufficient information to code with codable children: Secondary | ICD-10-CM

## 2015-08-12 ENCOUNTER — Other Ambulatory Visit: Payer: Self-pay | Admitting: "Endocrinology

## 2015-08-12 LAB — COMPLETE METABOLIC PANEL WITH GFR
ALT: 26 U/L (ref 9–46)
AST: 22 U/L (ref 10–35)
Albumin: 3.6 g/dL (ref 3.6–5.1)
Alkaline Phosphatase: 47 U/L (ref 40–115)
BUN: 35 mg/dL — AB (ref 7–25)
CALCIUM: 9.1 mg/dL (ref 8.6–10.3)
CHLORIDE: 104 mmol/L (ref 98–110)
CO2: 24 mmol/L (ref 20–31)
Creat: 1.04 mg/dL (ref 0.70–1.18)
GFR, EST NON AFRICAN AMERICAN: 72 mL/min (ref >=60)
GFR, Est African American: 83 mL/min (ref >=60)
Glucose, Bld: 145 mg/dL — ABNORMAL HIGH (ref 65–99)
POTASSIUM: 4.9 mmol/L (ref 3.5–5.3)
SODIUM: 140 mmol/L (ref 135–146)
Total Bilirubin: 0.5 mg/dL (ref 0.2–1.2)
Total Protein: 5.8 g/dL — ABNORMAL LOW (ref 6.1–8.1)

## 2015-08-12 LAB — HEMOGLOBIN A1C
HEMOGLOBIN A1C: 8.6 % — AB (ref <5.7)
Mean Plasma Glucose: 200 mg/dL

## 2015-08-18 ENCOUNTER — Ambulatory Visit (INDEPENDENT_AMBULATORY_CARE_PROVIDER_SITE_OTHER): Payer: Medicare Other | Admitting: "Endocrinology

## 2015-08-18 ENCOUNTER — Encounter: Payer: Self-pay | Admitting: "Endocrinology

## 2015-08-18 VITALS — BP 93/45 | HR 71 | Ht 72.0 in | Wt 210.0 lb

## 2015-08-18 DIAGNOSIS — I10 Essential (primary) hypertension: Secondary | ICD-10-CM

## 2015-08-18 DIAGNOSIS — Z794 Long term (current) use of insulin: Secondary | ICD-10-CM | POA: Diagnosis not present

## 2015-08-18 DIAGNOSIS — E1122 Type 2 diabetes mellitus with diabetic chronic kidney disease: Secondary | ICD-10-CM | POA: Diagnosis not present

## 2015-08-18 DIAGNOSIS — E785 Hyperlipidemia, unspecified: Secondary | ICD-10-CM

## 2015-08-18 DIAGNOSIS — E1165 Type 2 diabetes mellitus with hyperglycemia: Secondary | ICD-10-CM

## 2015-08-18 MED ORDER — INSULIN GLARGINE 100 UNIT/ML SOLOSTAR PEN: 30.0000 [IU] | PEN_INJECTOR | Freq: Every day | SUBCUTANEOUS | 2 refills | Status: DC

## 2015-08-18 NOTE — Progress Notes (Signed)
Subjective:    Patient ID: Carl Miller, male    DOB: 07-05-1945,    Past Medical History:  Diagnosis Date  . Diabetes mellitus (HCC)   . 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 GERD (gastroesophageal reflux disease)   . Headache   . Hyperlipidemia   . Hypertension   . Palpitations   . Vision abnormalities   . Worsening headaches 01/28/2014   Past Surgical History:  Procedure Laterality Date  . BACK SURGERY    . DENTAL SURGERY    . LEFT HEART CATHETERIZATION WITH CORONARY ANGIOGRAM N/A 01/13/2014   Procedure: LEFT HEART CATHETERIZATION WITH CORONARY ANGIOGRAM;  Surgeon: Pamella Pert, MD;  Location: [facility] CATH LAB;  Service: Cardiovascular;  Laterality: N/A;  . lithotomy    . SHOULDER SURGERY     Social History   Social History  . Marital status: Single    Spouse name: N/A  . Number of children: N/A  . Years of education: N/A   Social History Main Topics  . Smoking status: Former Games developer  . Smokeless tobacco: Former Neurosurgeon    Quit date: 01/17/1997  . Alcohol use No     Comment: quit 12/2012  . Drug use: No  . Sexual activity: Not Asked   Other Topics Concern  . None   Social History Narrative   Caffeine 30 ounces/ daily  (2 diet Mtn Dew/ week. Retired, lives alone.  Divorced.    Outpatient Encounter Prescriptions as of 08/18/2015  Medication Sig  . Insulin Glargine (LANTUS SOLOSTAR) 100 UNIT/ML Solostar Pen Inject 30 Units into the skin at bedtime.  . [DISCONTINUED] Insulin Glargine (LANTUS SOLOSTAR [place]) Inject 30 Units into the skin at bedtime.  Marland Kitchen alendronate (FOSAMAX) 70 MG tablet Take 70 mg by mouth every Saturday. Take with a full glass of water on an empty stomach.  Marland Kitchen amLODipine (NORVASC) 5 MG tablet Take 5 mg by mouth daily.  Marland Kitchen aspirin EC 81 MG tablet Take 81 mg by mouth daily.  Marland Kitchen atorvastatin (LIPITOR) 40 MG tablet Take 40 mg by mouth daily.  . B-D ULTRAFINE III SHORT PEN 31G X 8 MM MISC USE 1 AS  DIRECTED  . carvedilol (COREG) 12.5 MG tablet Take 12.5 mg by mouth 2 (two) times daily with a meal.  . carvedilol (COREG) 25 MG tablet Take 25 mg by mouth 2 (two) times daily with a meal.  . dorzolamide (TRUSOPT) 2 % ophthalmic solution Place 1 drop into both eyes 2 (two) times daily.  . fenofibrate 160 MG tablet   . furosemide (LASIX) 20 MG tablet Take 20 mg by mouth 2 (two) times daily.  Marland Kitchen gemfibrozil (LOPID) 600 MG tablet Take 600 mg by mouth 2 (two) times daily before a meal.  . ibuprofen (ADVIL,MOTRIN) 800 MG tablet Take 800 mg by mouth 3 (three) times daily as needed for mild pain.  [place] Ethyl (VASCEPA) 1 G CAPS Take 1 g by mouth 2 (two) times daily.  Marland Kitchen latanoprost (XALATAN) 0.005 % ophthalmic solution Place 1 drop into both eyes at bedtime.  . metFORMIN (GLUCOPHAGE) 500 MG tablet Take 500 mg by mouth 2 (two) times daily with a meal.  . Multiple Vitamin (MULTIVITAMIN WITH MINERALS) TABS tablet Take 1 tablet by mouth daily.  Marland Kitchen omeprazole (PRILOSEC) 20 MG capsule Take 20 mg by mouth daily.  . pantoprazole (PROTONIX) 40 MG tablet   . spironolactone (ALDACTONE) 25 MG tablet   . temazepam (RESTORIL)  30 MG capsule Take 30 mg by mouth at bedtime as needed for sleep.  . valsartan (DIOVAN) 320 MG tablet Take 320 mg by mouth daily.  . Vitamin D, Ergocalciferol, (DRISDOL) 50000 UNITS CAPS capsule Take 50,000 Units by mouth every Saturday.  . [DISCONTINUED] Insulin Glargine (LANTUS SOLOSTAR) 100 UNIT/ML Solostar Pen INJECT 20 UNITS INTO THE SKIN AT BEDTIME   No facility-administered encounter medications on file as of 08/18/2015.    ALLERGIES: Allergies  Allergen Reactions  . Tape Other (See Comments)    Latex tape - pulls skin off.  NO latex allergy.   VACCINATION STATUS:  There is no immunization history on file for this patient.  Diabetes  He presents for his follow-up diabetic visit. He has type 2 diabetes mellitus. Onset time: He was diagnosed at approximate age of 47 years. His  disease course has been worsening. There are no hypoglycemic associated symptoms. Pertinent negatives for hypoglycemia include no confusion, headaches, pallor or seizures. There are no diabetic associated symptoms. Pertinent negatives for diabetes include no chest pain, no fatigue, no polydipsia, no polyphagia, no polyuria and no weakness. Symptoms are worsening. 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 Risk factors for coronary artery disease include diabetes mellitus, dyslipidemia, male sex, tobacco exposure and sedentary lifestyle. Current diabetic treatment includes insulin injections and oral agent (monotherapy). He is compliant with treatment most of the time. His weight is stable. He is following a generally unhealthy diet. He has had a previous visit with a dietitian. He rarely participates in exercise. His home blood glucose trend is decreasing steadily. His breakfast blood glucose range is generally >200 mg/dl. His highest blood glucose is >200 mg/dl. His overall blood glucose range is >200 mg/dl. An ACE inhibitor/angiotensin II receptor blocker is being taken. Eye exam is current.  Hypertension  This is a chronic problem. The current episode started more than 1 year ago. The problem is uncontrolled. Pertinent negatives include no chest pain, headaches, neck pain, palpitations or shortness of breath. Past treatments include angiotensin blockers. Hypertensive end-organ damage includes kidney disease.  Hyperlipidemia  This is a chronic problem. The current episode started more than 1 year ago. Pertinent negatives include no chest pain, myalgias or shortness of breath. Current antihyperlipidemic treatment includes statins. Risk factors for coronary artery disease include male sex, dyslipidemia, diabetes mellitus and a sedentary lifestyle.     Review of Systems  Constitutional: Negative for fatigue and unexpected weight change.  HENT: Negative for dental problem, mouth  sores and trouble swallowing.   Eyes: Negative for visual disturbance.  Respiratory: Negative for cough, choking, chest tightness, shortness of breath and wheezing.   Cardiovascular: Negative for chest pain, palpitations and leg swelling.  Gastrointestinal: Negative for abdominal distention, abdominal pain, constipation, diarrhea, nausea and vomiting.  Endocrine: Negative for polydipsia, polyphagia and polyuria.  Genitourinary: Negative for dysuria, flank pain, hematuria and urgency.  Musculoskeletal: Negative for back pain, gait problem, myalgias and neck pain.  Skin: Negative for pallor, rash and wound.  Neurological: Negative for seizures, syncope, weakness, numbness and headaches.  Psychiatric/Behavioral: Negative.  Negative for confusion and dysphoric mood.    Objective:    BP (!) 93/45   Pulse 71   Ht 6' (1.829 m)   Wt 210 lb (95.3 kg)   SpO2 98%   BMI 28.48 kg/m   Wt Readings from Last 3 Encounters:  08/18/15 210 lb (95.3 kg)  05/18/15 214 lb (97.1 kg)  05/18/15 214 lb (97.1 kg)    Physical Exam  Constitutional: He is oriented to person, place, and time. He appears well-developed and well-nourished. He is cooperative. No distress.  HENT:  Head: Normocephalic and atraumatic.  Eyes: EOM are normal.  Neck: Normal range of motion. Neck supple. No tracheal deviation present. No thyromegaly present.  Cardiovascular: Normal rate, S1 normal, S2 normal and normal heart sounds.  Exam reveals no gallop.   No murmur heard. Pulses:      Dorsalis pedis pulses are 1+ on the right side, and 1+ on the left side.       Posterior tibial pulses are 1+ on the right side, and 1+ on the left side.  Pulmonary/Chest: Breath sounds normal. No respiratory distress. He has no wheezes.  Abdominal: Soft. Bowel sounds are normal. He exhibits no distension. There is no tenderness. There is no guarding and no CVA tenderness.  Musculoskeletal: He exhibits no edema.       Right shoulder: He exhibits no  swelling and no deformity.  Neurological: He is alert and oriented to person, place, and time. He has normal strength and normal reflexes. No cranial nerve deficit or sensory deficit. Gait normal.  Skin: Skin is warm and dry. No rash noted. No cyanosis. Nails show no clubbing.  Psychiatric: He has a normal mood and affect. His speech is normal and behavior is normal. Judgment and thought content normal. Cognition and memory are normal.     Recent Results (from the past 2160 hour(s))  COMPLETE METABOLIC PANEL WITH GFR     Status: Abnormal   Collection Time: 08/12/15  7:43 AM  Result Value Ref Range   Sodium 140 135 - 146 mmol/L   Potassium 4.9 3.5 - 5.3 mmol/L   Chloride 104 98 - 110 mmol/L   CO2 24 20 - 31 mmol/L   Glucose, Bld 145 (H) 65 - 99 mg/dL   BUN 35 (H) 7 - 25 mg/dL   Creat 1.61 0.96 - 0.45 mg/dL    Comment:   For patients > or = 71 years of age: The upper reference limit for Creatinine is approximately 13% higher for people identified as African-American.      Total Bilirubin 0.5 0.2 - 1.2 mg/dL   Alkaline Phosphatase 47 40 - 115 U/L   AST 22 10 - 35 U/L   ALT 26 9 - 46 U/L   Total Protein 5.8 (L) 6.1 - 8.1 g/dL   Albumin 3.6 3.6 - 5.1 g/dL   Calcium 9.1 8.6 - 40.9 mg/dL   GFR, Est African American 83 >=60 mL/min   GFR, Est Non African American 72 >=60 mL/min  Hemoglobin A1c     Status: Abnormal   Collection Time: 08/12/15  7:43 AM  Result Value Ref Range   Hgb A1c MFr Bld 8.6 (H) <5.7 %    Comment:   For someone without known diabetes, a hemoglobin A1c value of 6.5% or greater indicates that they may have diabetes and this should be confirmed with a follow-up test.   For someone with known diabetes, a value <7% indicates that their diabetes is well controlled and a value greater than or equal to 7% indicates suboptimal control. A1c targets should be individualized based on duration of diabetes, age, comorbid conditions, and other considerations.   Currently,  no consensus exists for use of hemoglobin A1c for diagnosis of diabetes for children.      Mean Plasma Glucose 200 mg/dL      Assessment & Plan:   1. Uncontrolled type 2 diabetes mellitus  with chronic kidney disease, with long-term current use of insulin, unspecified CKD stage (HCC)  His  diabetes is  complicated by  significant proteinuria possibly indicating nephropathy of multiple etiologies including diabetes and hypertension and patient remains at a high risk for more acute and chronic complications of diabetes which include CAD, CVA, CKD, retinopathy, and neuropathy. These are all discussed in detail with the patient.  Patient came with Worsening glucose profile, and  recent A1c increased to 8.6% from 7.6 %,  After generally improving from 12%. - He complains of cost of insulin and did not increase his Lantus to 30 units as advised last visit.   Glucose logs and insulin administration records pertaining to this visit,  to be scanned into patient's records.  Recent labs reviewed.   - I have re-counseled the patient on diet management and weight loss  by adopting a carbohydrate restricted / protein rich  Diet.  - Suggestion is made for patient to avoid simple carbohydrates   from their diet including Cakes , Desserts, Ice Cream,  Soda (  diet and regular) , Sweet Tea , Candies,  Chips, Cookies, Artificial Sweeteners,   and "Sugar-free" Products .  This will help patient to have stable blood glucose profile and potentially avoid unintended  Weight gain.  - Patient is advised to stick to a routine mealtimes to eat 3 meals  a day and avoid unnecessary snacks ( to snack only to correct hypoglycemia).  - The patient  is  scheduled with Norm Salt, RDN, CDE for individualized DM education.  - I have approached patient with the following individualized plan to manage diabetes and patient agrees.  - Because of loss of control, he will need more insulin therapy. -It could be premixed  insulin twice a day with breakfast and supper or basal/bolus insulin. However patient is hesitant to increase insulin therapy.  - I offered him cheaper insulin options Novolin 70/30, however he declined due to the fact that he has to draw it from a vial. - Until his next visit I advised him to increase  basal insulin Lantus 30 units qhs, associated with monitoring of BG BID. I advised him to avoid unnecessary snacks in the evening.  -Patient is encouraged to call clinic for blood glucose levels less than 70 or above 200 mg /dl. - His renal function is improving, however I advised him to continue low-dose metformin 500 mg by mouth twice a day.   - if his A1c remains elevated, he will be reapproached for more intensive insulin placement -  I advised him to stay in close follow up with the nephrologist. - Patient specific target  for A1c; LDL, HDL, Triglycerides, and  Waist Circumference were discussed in detail.  2) BP/HTN: controlled . Continue current medications including ACEI/ARB. 3) Lipids/HPL:  continue statins. 4)  Weight/Diet: CDE consult in progress, exercise, and carbohydrates information provided.  5) Chronic Care/Health Maintenance:  -Patient is on ACEI/ARB and Statin medications and encouraged to continue to follow up with Ophthalmology, Podiatrist at least yearly or according to recommendations, and advised to  stay away from smoking. I have recommended yearly flu vaccine and pneumonia vaccination at least every 5 years; moderate intensity exercise for up to 150 minutes weekly; and  sleep for at least 7 hours a day.  I advised patient to maintain close follow up with their PCP for primary care needs.  Patient is asked to bring meter and  blood glucose logs during their next visit.  Follow up plan: Return in about 3 months (around 11/18/2015) for follow up with pre-visit labs, meter, and logs.  Marquis Lunch, MD Phone: [phone]  Fax: [phone]   08/18/2015, 10:20 AM

## 2015-08-18 NOTE — Patient Instructions (Signed)

## 2015-08-19 ENCOUNTER — Encounter: Payer: Self-pay | Admitting: Nutrition

## 2015-08-19 ENCOUNTER — Encounter: Payer: Medicare Other | Attending: Family Medicine | Admitting: Nutrition

## 2015-08-19 ENCOUNTER — Ambulatory Visit: Payer: Medicare Other | Admitting: "Endocrinology

## 2015-08-19 VITALS — Ht 72.0 in | Wt 211.0 lb

## 2015-08-19 DIAGNOSIS — E1165 Type 2 diabetes mellitus with hyperglycemia: Secondary | ICD-10-CM | POA: Insufficient documentation

## 2015-08-19 DIAGNOSIS — E118 Type 2 diabetes mellitus with unspecified complications: Secondary | ICD-10-CM

## 2015-08-19 DIAGNOSIS — IMO0002 Reserved for concepts with insufficient information to code with codable children: Secondary | ICD-10-CM

## 2015-08-19 DIAGNOSIS — Z794 Long term (current) use of insulin: Secondary | ICD-10-CM

## 2015-08-19 NOTE — Progress Notes (Signed)
  Medical Nutrition Therapy:  Appt start time: 900 end time:  0930.  Assessment:  Primary concerns today: Follow up DM. Saw Dr. Fransico Him today. A1C 8.6%.   Increased to 30 units of Lantus today by  Dr. Fransico Him Metformin 500 mg BID.  Diet is doing very well. Eating three meals. Can't walk a lot. Walks with a cane. Compliant with diet and medications.  Diet is improving nicely.  Lab Results  Component Value Date   HGBA1C 8.6 (H) 08/12/2015    Preferred Learning Style:     No preference indicated   Learning Readiness:   Ready  Change in progress   MEDICATIONS:    DIETARY INTAKE:  24-hr recall:  B ( AM): 1 /.2 c Special K and 1/2 cup  Strawberries, 1 c almond  Milk.Snk ( AM): none L ( PM): sandwich and water Snk ( PM):  D ( PM): Meat and various veggies, water Snk ( PM):  Beverages: coffee and water  Usual physical activity: Walks some but not good balance and leg issues.  Estimated energy needs: 1800 calories 200 g carbohydrates 120 g protein 44 g fat  Progress Towards Goal(s):  In progress.   Nutritional Diagnosis:  NB-1.1 Food and nutrition-related knowledge deficit As related to DM.  As evidenced by A1C 7%.    Intervention:  Nutrition and Diabetes education provided on My Plate, CHO counting, meal planning, portion sizes, timing of meals, avoiding snacks between meals unless having a low blood sugar, target ranges for A1C and blood sugars, signs/symptoms and treatment of hyper/hypoglycemia, monitoring blood sugars, taking medications as prescribed, benefits of exercising 30 minutes per day and prevention of complications of DM. Meal planning, reviewed CHO counting and portion control and avoiding snacks at night.    Goals 1. Follow Plate Method 2. Increase fresh fruits and vegetables 3.  Continue drinking water 4. Get A1C down to 7% or lower  Teaching Method Utilized:  Visual Auditory Hands on  Handouts given during visit include:  The Plate  Method    Barriers to learning/adherence to lifestyle change: None  Demonstrated degree of understanding via:  Teach Back   Monitoring/Evaluation:  Dietary intake, exercise, meal planning, SBG, and body weight in 3 month(s).

## 2015-08-19 NOTE — Patient Instructions (Addendum)
Goals 1. Follow Plate Method 2. Increase fresh fruits and vegetables 3.  Continue drinking water 4. Get A1C down to 7% or lower.

## 2015-11-04 ENCOUNTER — Other Ambulatory Visit: Payer: Self-pay | Admitting: Interventional Radiology

## 2015-11-04 DIAGNOSIS — I878 Other specified disorders of veins: Secondary | ICD-10-CM

## 2015-11-19 ENCOUNTER — Ambulatory Visit: Payer: Medicare Other | Admitting: Nutrition

## 2015-11-19 ENCOUNTER — Ambulatory Visit: Payer: Medicare Other | Admitting: "Endocrinology

## 2016-09-16 ENCOUNTER — Ambulatory Visit (INDEPENDENT_AMBULATORY_CARE_PROVIDER_SITE_OTHER): Payer: Medicare Other | Admitting: Podiatry

## 2016-09-16 ENCOUNTER — Encounter: Payer: Self-pay | Admitting: Podiatry

## 2016-09-16 VITALS — BP 168/93 | HR 77 | Temp 96.4°F | Resp 16

## 2016-09-16 DIAGNOSIS — B351 Tinea unguium: Secondary | ICD-10-CM

## 2016-09-16 DIAGNOSIS — E1142 Type 2 diabetes mellitus with diabetic polyneuropathy: Secondary | ICD-10-CM

## 2016-09-16 DIAGNOSIS — E1165 Type 2 diabetes mellitus with hyperglycemia: Secondary | ICD-10-CM | POA: Insufficient documentation

## 2016-09-16 NOTE — Progress Notes (Signed)
Subjective:  Patient ID: Carl Miller, male    DOB: 27-Aug-1945,  MRN: [id number] HPI Chief Complaint  Patient presents with  . Diabetes    Patient states he can't cut his own toenails, hallux nails are incurvated, but not sore, last A1c was 7.2    71 y.o. male presents with the above complaint. Reports significant numbness and tingling to his feet. Last A1C was 7.2. States that he is worried having neuropathy that his nails could cause an issue.  Past Medical History:  Diagnosis Date  . Diabetes mellitus (HCC)   . 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 GERD (gastroesophageal reflux disease)   . Headache   . Hyperlipidemia   . Hypertension   . Palpitations   . Vision abnormalities   . Worsening headaches 01/28/2014   Past Surgical History:  Procedure Laterality Date  . BACK SURGERY    . DENTAL SURGERY    . LEFT HEART CATHETERIZATION WITH CORONARY ANGIOGRAM N/A 01/13/2014   Procedure: LEFT HEART CATHETERIZATION WITH CORONARY ANGIOGRAM;  Surgeon: Pamella Pert, MD;  Location: [facility] CATH LAB;  Service: Cardiovascular;  Laterality: N/A;  . lithotomy    . SHOULDER SURGERY      Current Outpatient Prescriptions:  .  alendronate (FOSAMAX) 70 MG tablet, Take 70 mg by mouth every Saturday. Take with a full glass of water on an empty stomach., Disp: , Rfl:  .  amLODipine (NORVASC) 5 MG tablet, Take 5 mg by mouth daily., Disp: , Rfl:  .  aspirin EC 81 MG tablet, Take 81 mg by mouth daily., Disp: , Rfl:  .  atorvastatin (LIPITOR) 40 MG tablet, Take 40 mg by mouth daily., Disp: , Rfl:  .  carvedilol (COREG) 25 MG tablet, Take 25 mg by mouth 2 (two) times daily with a meal., Disp: , Rfl:  .  chlorthalidone (HYGROTON) 25 MG tablet, Take 25 mg by mouth daily., Disp: , Rfl:  .  Insulin Glargine (LANTUS SOLOSTAR) 100 UNIT/ML Solostar Pen, Inject 30 Units into the skin at bedtime., Disp: 5 pen, Rfl: 2 .  losartan (COZAAR) 100 MG tablet,  Take 100 mg by mouth daily., Disp: , Rfl:  .  metFORMIN (GLUCOPHAGE) 500 MG tablet, Take 500 mg by mouth 2 (two) times daily with a meal., Disp: , Rfl:  .  Multiple Vitamin (MULTIVITAMIN WITH MINERALS) TABS tablet, Take 1 tablet by mouth daily., Disp: , Rfl:  .  pantoprazole (PROTONIX) 40 MG tablet, , Disp: , Rfl:  .  Vitamin D, Ergocalciferol, (DRISDOL) 50000 UNITS CAPS capsule, Take 50,000 Units by mouth every Saturday., Disp: , Rfl:  .  B-D ULTRAFINE III SHORT PEN 31G X 8 MM MISC, USE 1 AS DIRECTED, Disp: 50 each, Rfl: 2 .  dorzolamide (TRUSOPT) 2 % ophthalmic solution, Place 1 drop into both eyes 2 (two) times daily., Disp: , Rfl:  .  Icosapent Ethyl (VASCEPA) 1 G CAPS, Take 1 g by mouth 2 (two) times daily., Disp: , Rfl:  .  latanoprost (XALATAN) 0.005 % ophthalmic solution, Place 1 drop into both eyes at bedtime., Disp: , Rfl:   Allergies  Allergen Reactions  . Tape Other (See Comments)    Latex tape - pulls skin off.  NO latex allergy.   Review of Systems  HENT: Positive for hearing loss, sinus pressure and tinnitus.   Musculoskeletal: Positive for arthralgias, back pain, gait problem and myalgias.  Neurological: Positive for headaches.  Hematological: Bruises/bleeds easily.  All other systems reviewed and are negative.  Objective:   Vitals:   09/16/16 0922  BP: (!) 168/93  Pulse: 77  Resp: 16  Temp: (!) 96.4 F (35.8 C)   General AA&O x3. Normal mood and affect.  Vascular Dorsalis pedis and posterior tibial pulses  barely palpable bilaterally  Capillary refill delayed ~ 5 seconds to all digits. Pedal hair growth absent.  Neurologic Epicritic sensation diminished bilaterally. Protective sensation with 5.07 monofilament  0/5 bilaterally. Vibratory sensation absent bilaterally up to the ankle.  Dermatologic No open lesions. Interspaces clear of maceration.  Normal skin temperature and turgor. Hyperkeratotic lesions: none bilaterally. Nails: brittle, thickening, pincer  shaped, ingrowing  Orthopedic: No history of amputation. MMT 5/5 in dorsiflexion, plantarflexion, inversion, and eversion. Normal lower extremity joint ROM without pain or crepitus.     Assessment & Plan:  Diabetes with DPN, PAD, Onychomycosis -Educated on diabetic footcare. Diabetic risk level 1 -Nails x10 debrided as below.  Procedure: Nail Debridement Rationale: Patient meets criteria for routine foot care due to two Class B findings Type of Debridement: manual, sharp debridement. Instrumentation: Nail nipper, rotary burr. Number of Nails: 10  Return in about 3 months (around 12/16/2016).

## 2016-12-05 ENCOUNTER — Emergency Department (HOSPITAL_COMMUNITY): Payer: Medicare Other

## 2016-12-05 ENCOUNTER — Emergency Department (HOSPITAL_COMMUNITY)
Admission: EM | Admit: 2016-12-05 | Discharge: 2016-12-05 | Disposition: A | Discharge status: Home / Self Care | Payer: Medicare Other | Attending: Emergency Medicine | Admitting: Emergency Medicine

## 2016-12-05 ENCOUNTER — Other Ambulatory Visit: Payer: Self-pay

## 2016-12-05 ENCOUNTER — Encounter (HOSPITAL_COMMUNITY): Payer: Self-pay | Admitting: Emergency Medicine

## 2016-12-05 DIAGNOSIS — R61 Generalized hyperhidrosis: Secondary | ICD-10-CM | POA: Diagnosis not present

## 2016-12-05 DIAGNOSIS — Z79899 Other long term (current) drug therapy: Secondary | ICD-10-CM | POA: Diagnosis not present

## 2016-12-05 DIAGNOSIS — R479 Unspecified speech disturbances: Secondary | ICD-10-CM | POA: Insufficient documentation

## 2016-12-05 DIAGNOSIS — R110 Nausea: Secondary | ICD-10-CM | POA: Insufficient documentation

## 2016-12-05 DIAGNOSIS — E1122 Type 2 diabetes mellitus with diabetic chronic kidney disease: Secondary | ICD-10-CM | POA: Insufficient documentation

## 2016-12-05 DIAGNOSIS — T1590XA Foreign body on external eye, part unspecified, unspecified eye, initial encounter: Secondary | ICD-10-CM

## 2016-12-05 DIAGNOSIS — Z87891 Personal history of nicotine dependence: Secondary | ICD-10-CM | POA: Diagnosis not present

## 2016-12-05 DIAGNOSIS — I129 Hypertensive chronic kidney disease with stage 1 through stage 4 chronic kidney disease, or unspecified chronic kidney disease: Secondary | ICD-10-CM | POA: Insufficient documentation

## 2016-12-05 DIAGNOSIS — N189 Chronic kidney disease, unspecified: Secondary | ICD-10-CM | POA: Insufficient documentation

## 2016-12-05 DIAGNOSIS — R42 Dizziness and giddiness: Secondary | ICD-10-CM | POA: Diagnosis not present

## 2016-12-05 DIAGNOSIS — Z7982 Long term (current) use of aspirin: Secondary | ICD-10-CM | POA: Diagnosis not present

## 2016-12-05 DIAGNOSIS — Z794 Long term (current) use of insulin: Secondary | ICD-10-CM | POA: Insufficient documentation

## 2016-12-05 LAB — BASIC METABOLIC PANEL
Anion gap: 7 (ref 5–15)
BUN: 25 mg/dL — AB (ref 6–20)
CALCIUM: 8.9 mg/dL (ref 8.9–10.3)
CHLORIDE: 104 mmol/L (ref 101–111)
CO2: 25 mmol/L (ref 22–32)
CREATININE: 1.08 mg/dL (ref 0.61–1.24)
GFR calc Af Amer: >60 mL/min (ref >60)
GFR calc non Af Amer: >60 mL/min (ref >60)
GLUCOSE: 180 mg/dL — AB (ref 65–99)
Potassium: 3.9 mmol/L (ref 3.5–5.1)
Sodium: 136 mmol/L (ref 135–145)

## 2016-12-05 LAB — CBC
HCT: 37.2 % — ABNORMAL LOW (ref 39.0–52.0)
Hemoglobin: 11.6 g/dL — ABNORMAL LOW (ref 13.0–17.0)
MCH: 29.4 pg (ref 26.0–34.0)
MCHC: 31.2 g/dL (ref 30.0–36.0)
MCV: 94.2 fL (ref 78.0–100.0)
Platelets: 175 10*3/uL (ref 150–400)
RBC: 3.95 MIL/uL — AB (ref 4.22–5.81)
RDW: 13.7 % (ref 11.5–15.5)
WBC: 12.4 10*3/uL — ABNORMAL HIGH (ref 4.0–10.5)

## 2016-12-05 LAB — URINALYSIS, ROUTINE W REFLEX MICROSCOPIC
Bacteria, UA: NONE SEEN
Bilirubin Urine: NEGATIVE
Glucose, UA: 50 mg/dL — AB
Hgb urine dipstick: NEGATIVE
KETONES UR: NEGATIVE mg/dL
Leukocytes, UA: NEGATIVE
Nitrite: NEGATIVE
Protein, ur: >=300 mg/dL — AB
SQUAMOUS EPITHELIAL / LPF: NONE SEEN
Specific Gravity, Urine: 1.012 (ref 1.005–1.030)
pH: 7 (ref 5.0–8.0)

## 2016-12-05 MED ORDER — SODIUM CHLORIDE 0.9 % IV BOLUS (SEPSIS)
500.0000 mL | Freq: Once | INTRAVENOUS | Status: AC
  Administered 2016-12-05: 500 mL via INTRAVENOUS

## 2016-12-05 MED ORDER — MECLIZINE HCL 25 MG PO TABS: 25.0000 mg | ORAL_TABLET | Freq: Three times a day (TID) | ORAL | 0 refills | Status: DC | PRN

## 2016-12-05 MED ORDER — MECLIZINE HCL 12.5 MG PO TABS
25.0000 mg | ORAL_TABLET | Freq: Once | ORAL | Status: AC
  Administered 2016-12-05: 25 mg via ORAL
  Filled 2016-12-05: qty 2

## 2016-12-05 MED ORDER — SODIUM CHLORIDE 0.9 % IV SOLN: INTRAVENOUS | Status: DC

## 2016-12-05 MED ORDER — LORAZEPAM 2 MG/ML IJ SOLN
1.0000 mg | Freq: Once | INTRAMUSCULAR | Status: AC
  Administered 2016-12-05: 1 mg via INTRAVENOUS

## 2016-12-05 MED ORDER — ONDANSETRON HCL 4 MG/2ML IJ SOLN
4.0000 mg | Freq: Once | INTRAMUSCULAR | Status: AC
  Administered 2016-12-05: 4 mg via INTRAVENOUS
  Filled 2016-12-05: qty 2

## 2016-12-05 MED ORDER — LORAZEPAM 2 MG/ML IJ SOLN
INTRAMUSCULAR | Status: AC
  Administered 2016-12-05: 1 mg via INTRAVENOUS
  Filled 2016-12-05: qty 1

## 2016-12-05 NOTE — ED Provider Notes (Signed)
[facility] EMERGENCY DEPARTMENT Provider Note   CSN: [id number] Arrival date & time: 12/05/16  0935     History   Chief Complaint Chief Complaint  Patient presents with  . Dizziness    HPI Carl Miller is a 71 y.o. male.  Patient with acute onset of room spinning dizziness this morning after being awake at 645.  Family thought perhaps may be there was some slurred speech noted at 830 this morning.  All has resolved now.  Patient has not had vertigo in the past.  Has had a headache on and off actually for almost 2 years.  But been a little worse lately.  Patient denies any visual changes denies any this in his arms or legs.      Past Medical History:  Diagnosis Date  . Diabetes mellitus (HCC)   . 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 GERD (gastroesophageal reflux disease)   . Headache   . Hyperlipidemia   . Hypertension   . Palpitations   . Vision abnormalities   . Worsening headaches 01/28/2014    Patient Active Problem List   Diagnosis Date Noted  . Type 2 diabetes mellitus with hyperglycemia (HCC) 09/16/2016  . Arthritis 05/05/2015  . 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 Generalized muscle weakness 05/05/2015  . GERD (gastroesophageal reflux disease) 05/05/2015  . Glaucoma 05/05/2015  . Hearing loss 05/05/2015  . Hypertension 05/05/2015  . Migraine headache 05/05/2015  . Situational depression 05/05/2015  . Venous stasis 05/05/2015  . Hyperlipemia 11/17/2014  . Essential (primary) hypertension 11/17/2014  . Venous intermittent claudication   . Bilateral leg edema   . Obstructive sleep apnea 05/23/2014  . Hypoxemia 05/23/2014  . Worsening headaches 01/28/2014  . DM (diabetes mellitus) (HCC) 01/28/2014  . Snoring 01/28/2014  . Cluster headache 01/28/2014  . Nasal congestion with rhinorrhea 01/28/2014  . 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 Abnormal nuclear stress test 12/16/2013    Past Surgical History:  Procedure Laterality Date  . BACK SURGERY    . DENTAL SURGERY    . LEFT HEART CATHETERIZATION WITH CORONARY ANGIOGRAM N/A 01/13/2014   Performed by Yates Decamp, MD at [facility] CATH LAB  . lithotomy    . SHOULDER SURGERY         Home Medications    Prior to Admission medications   Medication Sig Start Date End Date Taking? Authorizing Provider  alendronate (FOSAMAX) 70 MG tablet Take 70 mg by mouth every Saturday. Take with a full glass of water on an empty stomach.   Yes [provider]  amLODipine (NORVASC) 5 MG tablet Take 5 mg by mouth daily.   Yes [provider]  aspirin EC 81 MG tablet Take 81 mg by mouth daily.   Yes [provider]  atorvastatin (LIPITOR) 40 MG tablet Take 40 mg by mouth daily.   Yes [provider]  B-D ULTRAFINE III SHORT PEN 31G X 8 MM MISC USE 1 AS DIRECTED 01/13/15  Yes Nida, Denman George, MD  carvedilol (COREG) 25 MG tablet Take 25 mg by mouth 2 (two) times daily with a meal.   Yes [provider]  chlorthalidone (HYGROTON) 25 MG tablet Take 25 mg by mouth daily.   Yes [provider]  dorzolamide (TRUSOPT) 2 % ophthalmic solution Place 1 drop into both eyes 2 (two) times daily.   Yes [provider]  Insulin Glargine (LANTUS SOLOSTAR) 100 UNIT/ML Solostar Pen Inject 30 Units into the skin at bedtime. 08/18/15  Yes Nida, Denman George, MD  latanoprost (XALATAN) 0.005 % ophthalmic solution Place 1 drop into both eyes at bedtime.   Yes [provider]  loratadine (CLARITIN) 10 MG tablet Take 10 mg daily by mouth.   Yes [provider]  losartan (COZAAR) 100 MG tablet Take 100 mg by mouth daily.   Yes [provider]  metFORMIN (GLUCOPHAGE) 500 MG tablet Take 1,000 mg 2 (two) times daily with a meal by mouth.    Yes [provider]  Multiple Vitamin  (MULTIVITAMIN WITH MINERALS) TABS tablet Take 1 tablet by mouth daily.   Yes [provider]  pantoprazole (PROTONIX) 40 MG tablet Take 40 mg daily by mouth.  01/02/14  Yes [provider]  Vitamin D, Ergocalciferol, (DRISDOL) 50000 UNITS CAPS capsule Take 50,000 Units by mouth every Saturday.   Yes [provider]  meclizine (ANTIVERT) 25 MG tablet Take 1 tablet (25 mg total) 3 (three) times daily as needed by mouth for dizziness. 12/05/16   Vanetta Mulders, MD    Family History Family History  Problem Relation Age of Onset  . Stroke Mother   . Diabetes Mother   . Heart attack Father     Social History Social History   Tobacco Use  . Smoking status: Former Smoker    Last attempt to quit: 01/18/1997    Years since quitting: 19.8  . Smokeless tobacco: Former Neurosurgeon    Quit date: 01/17/1997  Substance Use Topics  . Alcohol use: No    Alcohol/week: 0.0 oz    Comment: quit 12/2012  . Drug use: No     Allergies   Aspirin and Tape   Review of Systems Review of Systems  Constitutional: Positive for diaphoresis. Negative for fever.  HENT: Negative for congestion and trouble swallowing.   Eyes: Negative for visual disturbance.  Respiratory: Negative for shortness of breath.   Cardiovascular: Negative for chest pain.  Gastrointestinal: Positive for nausea. Negative for abdominal pain and vomiting.  Genitourinary: Negative for dysuria.  Musculoskeletal: Negative for back pain.  Skin: Negative for rash.  Neurological: Positive for dizziness and speech difficulty. Negative for syncope, weakness and headaches.  Hematological: Does not bruise/bleed easily.  Psychiatric/Behavioral: Negative for confusion.     Physical Exam Updated Vital Signs BP (!) 175/77   Pulse 71   Temp 97.8 F (36.6 C) (Oral)   Resp 15   Ht 1.829 m (6')   Wt 101.6 kg (224 lb)   SpO2 97%   BMI 30.38 kg/m   Physical Exam  Constitutional: He is oriented to person, place, and  time. He appears well-developed and well-nourished. No distress.  HENT:  Head: Normocephalic and atraumatic.  Mouth/Throat: Oropharynx is clear and moist.  Eyes: Conjunctivae and EOM are normal. Pupils are equal, round, and reactive to light.  Neck: Normal range of motion. Neck supple.  Cardiovascular: Normal rate, regular rhythm and normal heart sounds.  Pulmonary/Chest: Effort normal and breath sounds normal. No respiratory distress.  Abdominal: Soft. Bowel sounds are normal. There is no tenderness.  Musculoskeletal: Normal range of motion. He exhibits edema.  Neurological: He is alert and oriented to person, place, and time.  Skin: Skin is warm. Capillary refill takes less than 2 seconds. He is not diaphoretic.  Nursing note and vitals reviewed.    ED Treatments / Results  Labs (all labs ordered  are listed, but only abnormal results are displayed) Labs Reviewed  BASIC METABOLIC PANEL - Abnormal; Notable for the following components:      Result Value   Glucose, Bld 180 (*)    BUN 25 (*)    All other components within normal limits  CBC - Abnormal; Notable for the following components:   WBC 12.4 (*)    RBC 3.95 (*)    Hemoglobin 11.6 (*)    HCT 37.2 (*)    All other components within normal limits  URINALYSIS, ROUTINE W REFLEX MICROSCOPIC - Abnormal; Notable for the following components:   Glucose, UA 50 (*)    Protein, ur >=300 (*)    All other components within normal limits    EKG  EKG Interpretation  Date/Time:  Monday December 05 2016 09:39:28 EST Ventricular Rate:  69 PR Interval:    QRS Duration: 110 QT Interval:  399 QTC Calculation: 428 R Axis:   -48 Text Interpretation:  Sinus rhythm Ventricular premature complex Incomplete left bundle branch block Inferior infarct, old Probable anterolateral infarct, old Interpretation limited secondary to artifact Confirmed by Vanetta MuldersZackowski, Palma Buster [phone]) on 12/05/2016 12:58:39 PM       Radiology Dg Eye Foreign  Body  Result Date: 12/05/2016 CLINICAL DATA:  Metal working/exposure; clearance prior to MRI EXAM: ORBITS FOR FOREIGN BODY - 2 VIEW COMPARISON:  None. FINDINGS: Water's views with eyes deviated toward the left and toward the right were obtained. There is no intraorbital radiopaque foreign body. Visualized paranasal sinuses are clear. No fracture or dislocation. IMPRESSION: No evidence of metallic foreign body within the orbits. Electronically Signed   By: Bretta BangWilliam  Woodruff III M.D.   On: 12/05/2016 14:00   Mr Brain Wo Contrast (neuro Protocol)  Result Date: 12/05/2016 CLINICAL DATA:  Ataxia. Dizziness, weakness, headache, diaphoresis, and nausea. EXAM: MRI HEAD WITHOUT CONTRAST TECHNIQUE: Multiplanar, multiecho pulse sequences of the brain and surrounding structures were obtained without intravenous contrast. COMPARISON:  None. FINDINGS: The study is mildly motion degraded. Brain: There is no evidence of acute infarct, intracranial hemorrhage, mass, midline shift, or extra-axial fluid collection. There is a small chronic cortical infarct in the anterolateral right frontal lobe. A tiny chronic cortical/subcortical infarct is noted in the lateral right occipital lobe. Patchy periventricular white matter T2 hyperintensities are nonspecific but compatible with chronic small vessel ischemic disease, mild for age. There is a dilated perivascular space versus chronic lacunar infarct in the left midbrain. Vascular: The distal right vertebral artery is not well seen and may be hypoplastic or occluded. Other major intracranial vascular flow voids are preserved. Skull and upper cervical spine: No suspicious marrow lesion. Sinuses/Orbits: Bilateral cataract extraction. Clear sinuses. Scratch sec clear paranasal sinuses. Small right mastoid effusion. Other: None. IMPRESSION: 1. No acute intracranial abnormality. 2. Mild chronic small vessel ischemic disease. 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  Electronically Signed   By: Sebastian AcheAllen  Grady M.D.   On: 12/05/2016 14:31    Procedures Procedures (including critical care time)  Medications Ordered in ED Medications  0.9 %  sodium chloride infusion (not administered)  meclizine (ANTIVERT) tablet 25 mg (25 mg Oral Given 12/05/16 1322)  ondansetron (ZOFRAN) injection 4 mg (4 mg Intravenous Given 12/05/16 1319)  sodium chloride 0.9 % bolus 500 mL (500 mLs Intravenous New Bag/Given 12/05/16 1319)  LORazepam (ATIVAN) injection 1 mg (1 mg Intravenous Given 12/05/16 1345)     Initial Impression / Assessment and Plan / ED Course  I have reviewed the triage vital  signs and the nursing notes.  Pertinent labs & imaging results that were available during my care of the patient were reviewed by me and considered in my medical decision making (see chart for details).     Patient with acute onset of vertigo MRI done to rule out stroke.  MRI negative but does show evidence of old small strokes.  No evidence of any brain tumor.  Patient significantly improved with meclizine.  Will continue that at home him follow-up with neurology return for any new or worse symptoms.  Family did state that there was some slurred speech for a brief period time around 8:30 in the morning.  That has resolved.  Final Clinical Impressions(s) / ED Diagnoses   Final diagnoses:  Vertigo    ED Discharge Orders        Ordered    meclizine (ANTIVERT) 25 MG tablet  3 times daily PRN     12/05/16 1509       Vanetta MuldersZackowski, Thermon Zulauf, MD 12/05/16 [phone]

## 2016-12-05 NOTE — ED Notes (Signed)
EDP notified of pt symptoms and nursing assessment. No other orders given at this time.

## 2016-12-05 NOTE — ED Triage Notes (Signed)
Per EMS, pt was cooking breakfast and reports became dizzy, diaphoretic, nauseated at approximately 745. Pt reports had BM when EMS arrived and reported some relief of symptoms. cbg en route 160. 4mg  of zofran en route. Intermittent slurred speech with EMS. Neuro exam en route WNL.   Pt alert and oriented. Airway patent. Pt reports mouth is "dry as a bone". nad noted. Speech clear. Pt able to move all extremities. No weakness,visual defeicits, or numbness reported. +3 BLE swelling.

## 2016-12-05 NOTE — Discharge Instructions (Signed)
Take the Antivert as directed.  MRI had no acute findings but did show evidence of old small strokes.  No evidence of new stroke.  No evidence of any brain tumors.  Follow-up with neurology call and make an appointment.  Return for any new or worse symptoms.

## 2016-12-05 NOTE — ED Notes (Signed)
Pt inquiring about PO fluids. Pt given mouth swab. Family at bedside and given blanket x1.

## 2016-12-05 NOTE — ED Notes (Signed)
Pt taken to MRI at this time

## 2016-12-14 ENCOUNTER — Telehealth: Payer: Self-pay | Admitting: Neurology

## 2016-12-14 NOTE — Telephone Encounter (Signed)
Spoke to pt's daughter over the phone-[facility] and is being referred to neurology for a f/u. He saw Dr. Vickey Hugerohmeier back in 2018, but is now requesting Dr. Roda ShuttersXu. Please review and let me know if this okay with you.

## 2016-12-14 NOTE — Telephone Encounter (Signed)
Yes, that's ok. Especially of neurovascular expertise is needed. CD

## 2016-12-14 NOTE — Telephone Encounter (Signed)
Sure. Please schedule with me next available. If it takes too long and if Dr. Pearlean BrownieSethi has slot for him quicker, I am OK too. Thanks.   Carl [name] Devincent, MD PhD Stroke Neurology 12/14/2016 5:56 PM

## 2016-12-15 NOTE — Telephone Encounter (Signed)
Thank you , Dr. Roda ShuttersXU .

## 2016-12-16 ENCOUNTER — Ambulatory Visit (INDEPENDENT_AMBULATORY_CARE_PROVIDER_SITE_OTHER): Payer: Medicare Other | Admitting: Podiatry

## 2016-12-16 DIAGNOSIS — E1142 Type 2 diabetes mellitus with diabetic polyneuropathy: Secondary | ICD-10-CM | POA: Diagnosis not present

## 2016-12-16 DIAGNOSIS — B351 Tinea unguium: Secondary | ICD-10-CM | POA: Diagnosis not present

## 2016-12-16 DIAGNOSIS — E1151 Type 2 diabetes mellitus with diabetic peripheral angiopathy without gangrene: Secondary | ICD-10-CM | POA: Diagnosis not present

## 2016-12-16 NOTE — Progress Notes (Signed)
  Subjective:  Patient ID: Carl Miller, male    DOB: 01-Feb-1945,  MRN: [id number]  Chief Complaint  Patient presents with  . Nail Problem    3 month nail trim   71 y.o. male returns for diabetic foot care. Last AMBS was 130. Denies new complaints. Reports numbness and tingling in their feet. Denies cramping in legs and thighs.  Objective:  There were no vitals filed for this visit. General AA&O x3. Normal mood and affect.  Vascular Dorsalis pedis pulses present 1+ bilaterally  Posterior tibial pulses absent bilaterally  Capillary refill normal to all digits. Pedal hair growth diminished.  Neurologic Epicritic sensation present bilaterally. Protective sensation with 5.07 monofilament  absent bilaterally. Vibratory sensation present bilaterally.  Dermatologic No open lesions. Interspaces clear of maceration.  Normal skin temperature and turgor. Hyperkeratotic lesions: none bilaterally. Nails: brittle, discoloration yellow, onychomycosis, thickening  Orthopedic: No history of amputation. MMT 5/5 in dorsiflexion, plantarflexion, inversion, and eversion. Normal lower extremity joint ROM without pain or crepitus.   Assessment & Plan:  Patient was evaluated and treated and all questions answered.  Diabetes with PAD, DPN, Onychomycosis -Educated on diabetic footcare. Diabetic risk level 1 -At risk foot care provided as below.  Procedure: Nail Debridement Rationale: Patient meets criteria for routine foot care due to Class B findings Type of Debridement: manual, sharp debridement. Instrumentation: Nail nipper, rotary burr. Number of Nails: 10  Return in about 3 months (around 03/16/2017) for Diabetic Foot Care.

## 2016-12-16 NOTE — Telephone Encounter (Signed)
Noted. Thank you both!

## 2017-02-15 ENCOUNTER — Telehealth: Payer: Self-pay | Admitting: Neurology

## 2017-02-15 NOTE — Telephone Encounter (Signed)
Spoke to pt's daughter-sending out apt info/forms in the mail.

## 2017-02-15 NOTE — Telephone Encounter (Signed)
Pts daughter is requesting a call back, he father would really rather be mailed new pt paperwork since he has problems reading and filling out things(daughter is aware there isn't much paperwork and that we normally don't mail it out) and daughter isn't sure if she will be able to come

## 2017-03-07 ENCOUNTER — Ambulatory Visit (INDEPENDENT_AMBULATORY_CARE_PROVIDER_SITE_OTHER): Payer: Medicare Other | Admitting: Neurology

## 2017-03-07 ENCOUNTER — Encounter: Payer: Self-pay | Admitting: Neurology

## 2017-03-07 VITALS — BP 143/82 | HR 76 | Ht 72.0 in | Wt 233.0 lb

## 2017-03-07 DIAGNOSIS — I10 Essential (primary) hypertension: Secondary | ICD-10-CM | POA: Diagnosis not present

## 2017-03-07 DIAGNOSIS — E785 Hyperlipidemia, unspecified: Secondary | ICD-10-CM

## 2017-03-07 DIAGNOSIS — Z794 Long term (current) use of insulin: Secondary | ICD-10-CM

## 2017-03-07 DIAGNOSIS — G459 Transient cerebral ischemic attack, unspecified: Secondary | ICD-10-CM | POA: Diagnosis not present

## 2017-03-07 DIAGNOSIS — E1165 Type 2 diabetes mellitus with hyperglycemia: Secondary | ICD-10-CM | POA: Diagnosis not present

## 2017-03-07 DIAGNOSIS — G4733 Obstructive sleep apnea (adult) (pediatric): Secondary | ICD-10-CM

## 2017-03-07 MED ORDER — CLOPIDOGREL BISULFATE 75 MG PO TABS: 75.0000 mg | ORAL_TABLET | Freq: Every day | ORAL | 3 refills | Status: AC

## 2017-03-07 NOTE — Patient Instructions (Signed)
-   continue lipitor for stroke prevention - change ASA 81mg  to plavix 75mg  daily for stroke prevention - will do CTA head and neck to evaluate blood vessels - will do heart ultrasound in Dr. Verl DickerGanji's office - Follow up with your primary care physician for stroke risk factor modification. Recommend maintain blood pressure goal <130/80, diabetes with hemoglobin A1c goal below 7.0% and lipids with LDL cholesterol goal below 70 mg/dL.  - check BP and glucose at home and record - diabetic diet and regular exercise - follow up in 2 months.

## 2017-03-07 NOTE — Progress Notes (Signed)
NEUROLOGY CLINIC FOLLOW UP PATIENT NOTE  NAME: Carl Miller DOB: [birth year] REFERRING PHYSICIAN: Barbie Banner, MD  I saw Carl Miller as a new consult in the neurovascular clinic today regarding  Chief Complaint  Patient presents with  . Follow-up    Stroke showed on scan from ED visit in 11/2016 on MRI, PT was seen in the past 2016 for headaches, pt does not wear cipap he states it does not work for him. Pt had back surgery twice, and neuropathy using cane for 5 years. Dr.Keyondra Lagrand did not see pt in hospital  .  HPI: Carl Miller is a 72 y.o. male with PMH of OSA on CPAP, HA, HTN, DM, HLD who presents as a new patient for episode of vertigo.   Pt went to ER on 12/05/16 for acute onset of room spinning dizziness while working in kitchen that morning. He turned to throw trash in to garbage bin and had sudden onset vertigo, room spinning, N/V, feeling sick, with double vision and slurry speech. No choking, no numbness, weakness, no HA or vision loss, but daughter said he looked pale and staggering on walking. EMS called and on arrival his BP was high but gradually coming down. The N/V lasted about one hour and dizziness gone in 7-8 hours. 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 Since then he had no recurrence.  Today he was sent here for further work up and evaluation.   He had PMH of HTN, DM and HLD. He is on amlodipine, Coreg, losartan, and chlorthalidone for BP control.  BP today 143/82.  He is also on Lantus and metformin for diabetes treatment.  He said he is sugar yesterday 195 and today 140.  He is on Lipitor 40 for HLD.  He also had a history of OSA, following with Dr. Vickey Huger, however, not compliant with CPAP.  He still has intermittent headache, 2-3 times per week, had a more behind left eye, squeezing and throbbing sensation lasting up day to several days.  Was taking ibuprofen in the past  but discontinued due to potential kidney injury.  Currently taking Tylenol for abortive therapy.  He has not been put on any preventive therapy yet.  He quit smoking 20 years ago.  Occasional alcohol, denies any illicit drug use.  Mom died of ICH at age of 63s.  Past Medical History:  Diagnosis Date  . Diabetes mellitus (HCC)   . 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 GERD (gastroesophageal reflux disease)   . Headache   . Hyperlipidemia   . Hypertension   . Palpitations   . Vision abnormalities   . Worsening headaches 01/28/2014   Past Surgical History:  Procedure Laterality Date  . BACK SURGERY    . DENTAL SURGERY    . LEFT HEART CATHETERIZATION WITH CORONARY ANGIOGRAM N/A 01/13/2014   Procedure: LEFT HEART CATHETERIZATION WITH CORONARY ANGIOGRAM;  Surgeon: Pamella Pert, MD;  Location: [facility] CATH LAB;  Service: Cardiovascular;  Laterality: N/A;  . lithotomy    . SHOULDER SURGERY     Family History  Problem Relation Age of Onset  . Stroke Mother   . Diabetes Mother   . Heart attack Father    Current Outpatient Medications  Medication Sig Dispense Refill  . alendronate (FOSAMAX) 70 MG tablet Take 70 mg by mouth every Saturday. Take with  a full glass of water on an empty stomach.    Marland Kitchen. amLODipine (NORVASC) 5 MG tablet Take 5 mg by mouth daily.    Marland Kitchen. atorvastatin (LIPITOR) 40 MG tablet Take 40 mg by mouth daily.    . B-D ULTRAFINE III SHORT PEN 31G X 8 MM MISC USE 1 AS DIRECTED 50 each 2  . carvedilol (COREG) 25 MG tablet Take 25 mg by mouth 2 (two) times daily with a meal.    . chlorthalidone (HYGROTON) 25 MG tablet Take 25 mg by mouth daily.    . Insulin Glargine (LANTUS SOLOSTAR) 100 UNIT/ML Solostar Pen Inject 30 Units into the skin at bedtime. 5 pen 2  . latanoprost (XALATAN) 0.005 % ophthalmic solution Place 1 drop into both eyes at bedtime.    Marland Kitchen. loratadine (CLARITIN) 10 MG tablet Take 10 mg daily by mouth.    .  losartan (COZAAR) 100 MG tablet Take 100 mg by mouth daily.    . meclizine (ANTIVERT) 25 MG tablet Take 1 tablet (25 mg total) 3 (three) times daily as needed by mouth for dizziness. 30 tablet 0  . metFORMIN (GLUCOPHAGE) 500 MG tablet Take 1,000 mg 2 (two) times daily with a meal by mouth.     . Multiple Vitamin (MULTIVITAMIN WITH MINERALS) TABS tablet Take 1 tablet by mouth daily.    . pantoprazole (PROTONIX) 40 MG tablet Take 40 mg daily by mouth.     . Vitamin D, Ergocalciferol, (DRISDOL) 50000 UNITS CAPS capsule Take 50,000 Units by mouth every Saturday.    . clopidogrel (PLAVIX) 75 MG tablet Take 1 tablet (75 mg total) by mouth daily. 90 tablet 3  . spironolactone (ALDACTONE) 25 MG tablet Take by mouth.     No current facility-administered medications for this visit.    Allergies  Allergen Reactions  . Aspirin     Jittery, nausea when take "full dose".  . Tape Other (See Comments)    Latex tape - pulls skin off.  NO latex allergy.   Social History   Socioeconomic History  . Marital status: Single    Spouse name: Not on file  . Number of children: Not on file  . Years of education: Not on file  . Highest education level: Not on file  Social Needs  . Financial resource strain: Not on file  . Food insecurity - worry: Not on file  . Food insecurity - inability: Not on file  . Transportation needs - medical: Not on file  . Transportation needs - non-medical: Not on file  Occupational History  . Not on file  Tobacco Use  . Smoking status: Former Smoker    Last attempt to quit: 01/18/1997    Years since quitting: 20.1  . Smokeless tobacco: Former NeurosurgeonUser    Quit date: 01/17/1997  Substance and Sexual Activity  . Alcohol use: No    Alcohol/week: 0.0 oz    Comment: quit 12/2012  . Drug use: No  . Sexual activity: Not on file  Other Topics Concern  . Not on file  Social History Narrative   Caffeine 30 ounces/ daily  (2 diet Mtn Dew/ week. Retired, lives alone.  Divorced.      Review of Systems Full 14 system review of systems performed and notable only for those listed, all others are neg:  Constitutional: Fatigue Cardiovascular: Swelling in legs Ear/Nose/Throat: Hearing loss, ringing in ears Skin: Moles Eyes: Eye Miller on the left Respiratory: Cough, wheezing, snoring Gastroitestinal:   Genitourinary: Urinary  problems, impotence Hematology/Lymphatic: Easy bruising, easy bleeding Endocrine:  Musculoskeletal: Joint Miller, joint swelling, cramps, aching muscles Allergy/Immunology: Allergies, runny nose Neurological: Headache, numbness, weakness Psychiatric: Decreased energy Sleep: Snoring   Physical Exam  Vitals:   03/07/17 1032  BP: (!) 143/82  Pulse: 76    General - Well nourished, well developed, in no apparent distress.  Ophthalmologic - Sharp disc margins OU.  Cardiovascular - Regular rate and rhythm.  Neck - supple, no nuchal rigidity .  Mental Status -  Level of arousal and orientation to time, place, and person were intact. Language including expression, naming, repetition, comprehension, reading, and writing was assessed and found intact. Fund of Knowledge was assessed and was intact.  Cranial Nerves II - XII - II - Visual field intact OU. III, IV, VI - Extraocular movements intact. V - Facial sensation intact bilaterally. VII - Facial movement intact bilaterally. VIII - Hard of hearing & vestibular intact bilaterally. X - Palate elevates symmetrically. XI - Chin turning & shoulder shrug intact bilaterally. XII - Tongue protrusion intact.  Motor Strength - The patient's strength was normal in all extremities and pronator drift was absent.  Bulk was normal and fasciculations were absent.   Motor Tone - Muscle tone was assessed at the neck and appendages and was normal.  Reflexes - The patient's reflexes were normal in all extremities and he had no pathological reflexes.  Sensory - Light touch, temperature/pinprick were  assessed and were normal.    Coordination - The patient had normal movements in the hands and feet with no ataxia or dysmetria.  Tremor was absent.  Gait and Station - The patient's transfers, posture, gait, station, and turns were observed as normal.   Imaging  I have personally reviewed the radiological images below and agree with the radiology interpretations.  Mr Brain Wo Contrast 12/05/2016 IMPRESSION: 1. No acute intracranial abnormality. 2. Mild chronic small vessel ischemic disease. 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   Lab Review none   Assessment and Plan:   In summary, Carl Miller is a 72 y.o. male with PMH of HTN, HLD, DM, OSA, HA presents with episode of vertigo in 11/2016. He developed vertigo, double vision, slurry speech, imbalance at that time and symptom resolved in hours. MRI did not show new stroke but remote infarcts at right frontal, occiptal and left pontine. Given stroke risk factors and remote infarcts in MRI, he needs more stroke work up and stroke prevention. Will do CTA head and neck, TTE and change ASA to plavix.   - continue lipitor for stroke prevention - change ASA 81mg  to plavix 75mg  daily for stroke prevention - will do CTA head and neck to evaluate blood vessels - will do heart ultrasound in Dr. Verl Dicker office - Follow up with your primary care physician for stroke risk factor modification. Recommend maintain blood pressure goal <130/80, diabetes with hemoglobin A1c goal below 7.0% and lipids with LDL cholesterol goal below 70 mg/dL.  - check BP and glucose at home and record - diabetic diet and regular exercise - follow up in 2 months.    Orders Placed This Encounter  Procedures  . CT ANGIO HEAD W OR WO CONTRAST    Standing Status:   Future    Standing Expiration Date:   05/06/2018    Order Specific Question:   If indicated for the ordered procedure, I authorize the administration of contrast media per Radiology protocol     Answer:   Yes  Order Specific Question:   Preferred imaging location?    Answer:   [facility]    Order Specific Question:   Radiology Contrast Protocol - do NOT remove file path    Answer:   \\[email or web address].[email or web address]  . CT ANGIO NECK W OR WO CONTRAST    Standing Status:   Future    Standing Expiration Date:   05/06/2018    Order Specific Question:   If indicated for the ordered procedure, I authorize the administration of contrast media per Radiology protocol    Answer:   Yes    Order Specific Question:   Preferred imaging location?    Answer:   [facility]    Order Specific Question:   Radiology Contrast Protocol - do NOT remove file path    Answer:   \\[email or web address].pdf  . ECHOCARDIOGRAM COMPLETE    Standing Status:   Future    Standing Expiration Date:   06/06/2018    Scheduling Instructions:     Please do in Dr. Verl Dicker office. Pt is Dr. Verl Dicker patients    Order Specific Question:   Where should this test be performed    Answer:   EXTERNAL    Order Specific Question:   Complete or Limited study?    Answer:   Complete    Order Specific Question:   With Image Enhancing Agent or without Image Enhancing Agent?    Answer:   With Image Enhancing Agent    Order Specific Question:   Reason for exam-Echo    Answer:   Stroke  434.91 / I163.9    Meds ordered this encounter  Medications  . clopidogrel (PLAVIX) 75 MG tablet    Sig: Take 1 tablet (75 mg total) by mouth daily.    Dispense:  90 tablet    Refill:  3    Patient Instructions  - continue lipitor for stroke prevention - change ASA 81mg  to plavix 75mg  daily for stroke prevention - will do CTA head and neck to evaluate blood vessels - will do heart ultrasound in Dr. Verl Dicker office - Follow up with your primary care physician for stroke risk factor modification. Recommend maintain blood pressure goal <130/80, diabetes with hemoglobin A1c goal below 7.0% and lipids with  LDL cholesterol goal below 70 mg/dL.  - check BP and glucose at home and record - diabetic diet and regular exercise - follow up in 2 months.    Marvel Plan, MD PhD [facility] [address], [address] [place], [place] [address] [phone]

## 2017-03-16 ENCOUNTER — Ambulatory Visit (INDEPENDENT_AMBULATORY_CARE_PROVIDER_SITE_OTHER): Payer: Medicare Other | Admitting: Podiatry

## 2017-03-16 ENCOUNTER — Encounter: Payer: Self-pay | Admitting: Podiatry

## 2017-03-16 DIAGNOSIS — E1151 Type 2 diabetes mellitus with diabetic peripheral angiopathy without gangrene: Secondary | ICD-10-CM

## 2017-03-16 DIAGNOSIS — B351 Tinea unguium: Secondary | ICD-10-CM | POA: Diagnosis not present

## 2017-03-16 NOTE — Progress Notes (Signed)
  Subjective:  Patient ID: Carl Miller, male    DOB: 26-Oct-1945,  MRN: [id number]  Chief Complaint  Patient presents with  . Diabetes    [place]   72 y.o. male returns for diabetic foot care. Did not check sugars this a.m. Endorses burning to the feet. Denies new issues.  Objective:  There were no vitals filed for this visit. General AA&O x3. Normal mood and affect.  Vascular Dorsalis pedis pulses present 1+ bilaterally  Posterior tibial pulses absent absent  Capillary refill normal to all digits. Pedal hair growth diminished.  Neurologic Epicritic sensation present bilaterally. Protective sensation with 5.07 monofilament  absent bilaterally. Vibratory sensation present bilaterally.  Dermatologic No open lesions. Interspaces clear of maceration.  Normal skin temperature and turgor. Hyperkeratotic lesions: none bilaterally. Nails: clubbing, discoloration yellow, irregular, wavy nails, onychomycosis, thickening  Orthopedic: No history of amputation. MMT 5/5 in dorsiflexion, plantarflexion, inversion, and eversion. Normal lower extremity joint ROM without pain or crepitus. Hammertoe deformities bilat HAV deformity bilat.   Assessment & Plan:  Patient was evaluated and treated and all questions answered.  Diabetes with PAD,DPN, Onychomycosis -Educated on diabetic footcare. Diabetic risk level 1 -At risk foot care provided as below. -Will make appointment for evaluation diabetic shoes  Procedure: Nail Debridement Rationale: Patient meets criteria for routine foot care due to class B findings Type of Debridement: manual, sharp debridement. Instrumentation: Nail nipper, rotary burr. Number of Nails: 10  Return in about 3 months (around 06/13/2017) for Diabetic Foot Care.

## 2017-03-23 ENCOUNTER — Ambulatory Visit
Admission: RE | Admit: 2017-03-23 | Discharge: 2017-03-23 | Disposition: A | Discharge status: Home / Self Care | Payer: Medicare Other | Source: Ambulatory Visit | Attending: Neurology | Admitting: Neurology

## 2017-03-23 DIAGNOSIS — G459 Transient cerebral ischemic attack, unspecified: Secondary | ICD-10-CM

## 2017-03-23 MED ORDER — IOPAMIDOL (ISOVUE-370) INJECTION 76%
75.0000 mL | Freq: Once | INTRAVENOUS | Status: AC | PRN
  Administered 2017-03-23: 75 mL via INTRAVENOUS

## 2017-03-28 ENCOUNTER — Other Ambulatory Visit: Payer: Medicare Other

## 2017-03-28 ENCOUNTER — Telehealth: Payer: Self-pay

## 2017-03-28 NOTE — Telephone Encounter (Signed)
-----   Message from Marvel PlanJindong Xu, MD sent at 03/24/2017  5:44 PM EST ----- Could you please let the patient know that the CT head and neck test done recently showed no large vessel blockage, but we do see some vessel in the brain have plaque building up and narrowing of the vessel. No need any surgical intervention but need to continue current treatment. Thanks.  Marvel PlanJindong Xu, MD PhD Stroke Neurology 03/24/2017 5:43 PM

## 2017-03-28 NOTE — Telephone Encounter (Signed)
Rn call patient about his Ct head and Ct neck. Patient stated both test showed no large vessel blockages. He did see some vessel in the brain have plaque building up and narrowing of the vessel. No need for surgical Intervention continue to follow treatment plan. Continue all his medications as prescribed.Patient verbalized understanding. ------

## 2017-04-07 ENCOUNTER — Telehealth: Payer: Self-pay | Admitting: Neurology

## 2017-04-07 NOTE — Telephone Encounter (Signed)
Patient's apt with Dr. Nadara EatonGangi is  04/28/2017 at 4:00 pm Telephone [phone] - fax [phone]. Patient is aware I have talked to him .

## 2017-04-07 NOTE — Telephone Encounter (Signed)
Pt has called for Dr Warren DanesXu's RN re:pt being told that Dr. Jacinto HalimGanji will do heart ultrasound.  Pt has not heard from that office and he wanted RN to be made aware to see if she can get the ball rolling re: him being scheduled.  Please call

## 2017-05-15 ENCOUNTER — Encounter: Payer: Self-pay | Admitting: Neurology

## 2017-05-15 ENCOUNTER — Ambulatory Visit (INDEPENDENT_AMBULATORY_CARE_PROVIDER_SITE_OTHER): Payer: Medicare Other | Admitting: Neurology

## 2017-05-15 VITALS — BP 129/65 | HR 72 | Ht 72.0 in | Wt 228.6 lb

## 2017-05-15 DIAGNOSIS — G459 Transient cerebral ischemic attack, unspecified: Secondary | ICD-10-CM

## 2017-05-15 DIAGNOSIS — E785 Hyperlipidemia, unspecified: Secondary | ICD-10-CM | POA: Diagnosis not present

## 2017-05-15 DIAGNOSIS — E1165 Type 2 diabetes mellitus with hyperglycemia: Secondary | ICD-10-CM

## 2017-05-15 DIAGNOSIS — Z794 Long term (current) use of insulin: Secondary | ICD-10-CM

## 2017-05-15 DIAGNOSIS — I679 Cerebrovascular disease, unspecified: Secondary | ICD-10-CM

## 2017-05-15 DIAGNOSIS — I10 Essential (primary) hypertension: Secondary | ICD-10-CM | POA: Diagnosis not present

## 2017-05-15 DIAGNOSIS — G4733 Obstructive sleep apnea (adult) (pediatric): Secondary | ICD-10-CM

## 2017-05-15 NOTE — Progress Notes (Signed)
NEUROLOGY CLINIC FOLLOW UP PATIENT NOTE  NAME: Carl Miller DOB: 04-30-1945 REFERRING PHYSICIAN: Barbie Banner, MD  I saw Carl Miller as a new consult in the neurovascular clinic today regarding  Chief Complaint  Patient presents with  . Follow-up    TIA follow up  .  HPI: Carl Miller is a 72 y.o. male with PMH of OSA on CPAP, HA, HTN, DM, HLD who presents as a new patient for episode of vertigo.   Pt went to ER on 12/05/16 for acute onset of room spinning dizziness while working in kitchen that morning. He turned to throw trash in to garbage bin and had sudden onset vertigo, room spinning, N/V, feeling sick, with double vision and slurry speech. No choking, no numbness, weakness, no HA or vision loss, but daughter said he looked pale and staggering on walking. EMS called and on arrival his BP was high but gradually coming down. The N/V lasted about one hour and dizziness gone in 7-8 hours. 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 Since then he had no recurrence.  Today he was sent here for further work up and evaluation.   He had PMH of HTN, DM and HLD. He is on amlodipine, Coreg, losartan, and chlorthalidone for BP control.  BP today 143/82.  He is also on Lantus and metformin for diabetes treatment.  He said he is sugar yesterday 195 and today 140.  He is on Lipitor 40 for HLD.  He also had a history of OSA, following with Dr. Vickey Huger, however, not compliant with CPAP.  He still has intermittent headache, 2-3 times per week, had a more behind left eye, squeezing and throbbing sensation lasting up day to several days.  Was taking ibuprofen in the past but discontinued due to potential kidney injury.  Currently taking Tylenol for abortive therapy.  He has not been put on any preventive therapy yet.  He quit smoking 20 years ago.  Occasional alcohol, denies any illicit drug use.  Mom died  of ICH at age of 22s.  Interval history During the interval time, pt has been doing well. No stroke like symptoms. 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 TTE done in Dr. Verl Dicker office, pending report. BP 129/65 today in clinic. As per pt daughter, his recent A1C 7.0 and his glucose at home 125-150s, occasionally at night goes to 250.   Past Medical History:  Diagnosis Date  . Diabetes mellitus (HCC)   . 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 GERD (gastroesophageal reflux disease)   . Headache   . Hyperlipidemia   . Hypertension   . Palpitations   . Stroke (HCC)   . Vision abnormalities   . Worsening headaches 01/28/2014   Past Surgical History:  Procedure Laterality Date  . BACK SURGERY    . DENTAL SURGERY    . LEFT HEART CATHETERIZATION WITH CORONARY ANGIOGRAM N/A 01/13/2014   Procedure: LEFT HEART CATHETERIZATION WITH CORONARY ANGIOGRAM;  Surgeon: Pamella Pert, MD;  Location: [facility] CATH LAB;  Service: Cardiovascular;  Laterality: N/A;  . lithotomy    . SHOULDER SURGERY     Family History  Problem Relation Age of Onset  . Stroke Mother   . Diabetes Mother   . Heart attack Father    Current Outpatient  Medications  Medication Sig Dispense Refill  . alendronate (FOSAMAX) 70 MG tablet Take 70 mg by mouth every Saturday. Take with a full glass of water on an empty stomach.    Marland Kitchen amLODipine (NORVASC) 5 MG tablet Take 5 mg by mouth daily.    Marland Kitchen atorvastatin (LIPITOR) 40 MG tablet Take 40 mg by mouth daily.    . B-D ULTRAFINE III SHORT PEN 31G X 8 MM MISC USE 1 AS DIRECTED 50 each 2  . carvedilol (COREG) 25 MG tablet Take 25 mg by mouth 2 (two) times daily with a meal.    . chlorthalidone (HYGROTON) 25 MG tablet Take 25 mg by mouth daily.    . clopidogrel (PLAVIX) 75 MG tablet Take 1 tablet (75 mg total) by mouth daily. 90 tablet 3  . Insulin Glargine (LANTUS SOLOSTAR) 100  UNIT/ML Solostar Pen Inject 30 Units into the skin at bedtime. 5 pen 2  . latanoprost (XALATAN) 0.005 % ophthalmic solution Place 1 drop into both eyes at bedtime.    Marland Kitchen loratadine (CLARITIN) 10 MG tablet Take 10 mg daily by mouth.    . losartan (COZAAR) 100 MG tablet Take 100 mg by mouth daily.    . meclizine (ANTIVERT) 25 MG tablet Take 1 tablet (25 mg total) 3 (three) times daily as needed by mouth for dizziness. 30 tablet 0  . metFORMIN (GLUCOPHAGE) 500 MG tablet Take 1,000 mg 2 (two) times daily with a meal by mouth.     . Multiple Vitamin (MULTIVITAMIN WITH MINERALS) TABS tablet Take 1 tablet by mouth daily.    . pantoprazole (PROTONIX) 40 MG tablet Take 40 mg daily by mouth.     . spironolactone (ALDACTONE) 25 MG tablet Take by mouth.    . Vitamin D, Ergocalciferol, (DRISDOL) 50000 UNITS CAPS capsule Take 50,000 Units by mouth every Saturday.     No current facility-administered medications for this visit.    Allergies  Allergen Reactions  . Aspirin     Jittery, nausea when take "full dose".  . Tape Other (See Comments)    Latex tape - pulls skin off.  NO latex allergy.   Social History   Socioeconomic History  . Marital status: Single    Spouse name: Not on file  . Number of children: Not on file  . Years of education: Not on file  . Highest education level: Not on file  Occupational History  . Not on file  Social Needs  . Financial resource strain: Not on file  . Food insecurity:    Worry: Not on file    Inability: Not on file  . Transportation needs:    Medical: Not on file    Non-medical: Not on file  Tobacco Use  . Smoking status: Former Smoker    Last attempt to quit: 01/18/1997    Years since quitting: 20.3  . Smokeless tobacco: Former Neurosurgeon    Quit date: 01/17/1997  Substance and Sexual Activity  . Alcohol use: No    Alcohol/week: 0.0 oz    Comment: quit 12/2012  . Drug use: No  . Sexual activity: Not on file  Lifestyle  . Physical activity:    Days per  week: Not on file    Minutes per session: Not on file  . Stress: Not on file  Relationships  . Social connections:    Talks on phone: Not on file    Gets together: Not on file    Attends religious service: Not on file  Active member of club or organization: Not on file    Attends meetings of clubs or organizations: Not on file    Relationship status: Not on file  . Intimate partner violence:    Fear of current or ex partner: Not on file    Emotionally abused: Not on file    Physically abused: Not on file    Forced sexual activity: Not on file  Other Topics Concern  . Not on file  Social History Narrative   Caffeine 30 ounces/ daily  (2 diet Mtn Dew/ week. Retired, lives alone.  Divorced.     Review of Systems Full 14 system review of systems performed and notable only for those listed, all others are neg:  Constitutional: Fatigue Cardiovascular: Swelling in legs Ear/Nose/Throat: Hearing loss, ringing in ears Skin: Moles Eyes: Eye Miller on the left Respiratory: Cough, wheezing, snoring Gastroitestinal:   Genitourinary: Urinary problems, impotence Hematology/Lymphatic: Easy bruising, easy bleeding Endocrine:  Musculoskeletal: Joint Miller, joint swelling, cramps, aching muscles Allergy/Immunology: Allergies, runny nose Neurological: Headache, numbness, weakness Psychiatric: Decreased energy Sleep: Snoring   Physical Exam  Vitals:   05/15/17 1123  BP: 129/65  Pulse: 72    General - Well nourished, well developed, in no apparent distress.  Ophthalmologic - Sharp disc margins OU.  Cardiovascular - Regular rate and rhythm.  Neck - supple, no nuchal rigidity .  Mental Status -  Level of arousal and orientation to time, place, and person were intact. Language including expression, naming, repetition, comprehension, reading, and writing was assessed and found intact. Fund of Knowledge was assessed and was intact.  Cranial Nerves II - XII - II - Visual field intact  OU. III, IV, VI - Extraocular movements intact. V - Facial sensation intact bilaterally. VII - Facial movement intact bilaterally. VIII - Hard of hearing & vestibular intact bilaterally. X - Palate elevates symmetrically. XI - Chin turning & shoulder shrug intact bilaterally. XII - Tongue protrusion intact.  Motor Strength - The patient's strength was normal in all extremities and pronator drift was absent.  Bulk was normal and fasciculations were absent.   Motor Tone - Muscle tone was assessed at the neck and appendages and was normal.  Reflexes - The patient's reflexes were normal in all extremities and he had no pathological reflexes.  Sensory - Light touch, temperature/pinprick were assessed and were normal.    Coordination - The patient had normal movements in the hands and feet with no ataxia or dysmetria.  Tremor was absent.  Gait and Station - The patient's transfers, posture, gait, station, and turns were observed as normal.   Imaging  I have personally reviewed the radiological images below and agree with the radiology interpretations.  Mr Brain Wo Contrast 12/05/2016 IMPRESSION: 1. No acute intracranial abnormality. 2. Mild chronic small vessel ischemic disease. 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   CTA head and neck 03/23/17 IMPRESSION: 1. Intracranial atherosclerosis without large vessel occlusion. 2. Mild-to-moderate left cavernous ICA stenosis. 3. Cervical carotid artery atherosclerosis without stenosis. 4. 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 5. Patent and dominant left vertebral artery with mild stenosis proximally. 6. Aortic Atherosclerosis (ICD10-I70.0) and Emphysema (ICD10-J43.9). 7. Small chronic right frontal and right occipital infarcts.  Lab Review none   Assessment   In summary, Carl Miller is a 72 y.o. male with PMH of HTN, HLD, DM, OSA, HA presents with episode of vertigo in 11/2016.  He developed vertigo, double vision, slurry speech, imbalance  at that time and symptom resolved in hours. MRI did not show new stroke but remote infarcts at right frontal, occiptal and left pontine. 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 TTE pending report. ASA changed to plavix. His episode in 11/2016 most likely due to posterior circulation TIA. However, given stroke risk factors and remote scattered infarcts on MRI, recommend 30 day cardiac event monitoring to rule out afib. Avoid hypotension  Plan: - continue plavix and lipitor for stroke prevention - will request TTE report from Dr. Verl Dicker office - recommend to have 30 day cardiac event monitoring to rule out afib with Dr. Jacinto Halim.  - avoid hypotension. Goal 120-150 is OK - check BP and glucose at home and record - Follow up with your primary care physician for stroke risk factor modification. Recommend maintain blood pressure goal <130/80, diabetes with hemoglobin A1c goal below 7.0% and lipids with LDL cholesterol goal below 70 mg/dL.  - diabetic diet and regular exercise - follow up in 4 months with Shanda Bumps.     I spent more than 25 minutes of face to face time with the patient. Greater than 50% of time was spent in counseling and coordination of care. We discussed further stroke work up, BP management.    No orders of the defined types were placed in this encounter.   No orders of the defined types were placed in this encounter.   Patient Instructions  - continue plavix and lipitor for stroke prevention - will request heart ultrasound report from Dr. Jacinto Halim - recommend to have 30 day cardiac event monitoring to rule out afib with Dr. Jacinto Halim.  - you do have some narrowing of the vessels in the brain, keep BP not too high or too low. Goal 120-150 is OK - check BP and glucose at home and record - Follow up with your primary care physician for stroke risk factor modification.  Recommend maintain blood pressure goal <130/80, diabetes with hemoglobin A1c goal below 7.0% and lipids with LDL cholesterol goal below 70 mg/dL.  - diabetic diet and regular exercise - follow up in 4 months with Shanda Bumps.     Marvel Plan, MD PhD [facility] [address], [address] [place], [place] [address] [phone]

## 2017-05-15 NOTE — Patient Instructions (Addendum)
-   continue plavix and lipitor for stroke prevention - will request heart ultrasound report from Dr. Jacinto Halim - recommend to have 30 day cardiac event monitoring to rule out afib with Dr. Jacinto Halim.  - you do have some narrowing of the vessels in the brain, keep BP not too high or too low. Goal 120-150 is OK - check BP and glucose at home and record - Follow up with your primary care physician for stroke risk factor modification. Recommend maintain blood pressure goal <130/80, diabetes with hemoglobin A1c goal below 7.0% and lipids with LDL cholesterol goal below 70 mg/dL.  - diabetic diet and regular exercise - follow up in 4 months with Shanda Bumps.

## 2017-05-18 DIAGNOSIS — I679 Cerebrovascular disease, unspecified: Secondary | ICD-10-CM | POA: Insufficient documentation

## 2017-05-19 NOTE — Progress Notes (Signed)
Received TTE report from Dr. Verl Dicker office  EF 65%, and LA 5.3cm dilated. Mild mitral regurg, trace tricuspid regurg, mild pulmonic regurg  Marvel Plan, MD PhD Stroke Neurology 05/19/2017 6:21 PM

## 2017-06-15 ENCOUNTER — Ambulatory Visit: Payer: Medicare Other | Admitting: Podiatry

## 2017-06-16 ENCOUNTER — Ambulatory Visit (INDEPENDENT_AMBULATORY_CARE_PROVIDER_SITE_OTHER): Payer: Medicare Other | Admitting: Podiatry

## 2017-06-16 DIAGNOSIS — E1142 Type 2 diabetes mellitus with diabetic polyneuropathy: Secondary | ICD-10-CM

## 2017-06-16 DIAGNOSIS — D689 Coagulation defect, unspecified: Secondary | ICD-10-CM | POA: Diagnosis not present

## 2017-06-16 DIAGNOSIS — B351 Tinea unguium: Secondary | ICD-10-CM

## 2017-06-16 NOTE — Progress Notes (Addendum)
Complaint:  Visit Type: Patient returns to my office for continued preventative foot care services. Complaint: Patient states" my nails have grown long and thick and become painful to walk and wear shoes" Patient has been diagnosed with DM with no foot complications. The patient presents for preventative foot care services. No changes to ROS.  Patient is taking plavix.  Podiatric Exam: Vascular: dorsalis pedis  are palpable bilateral.  Posterior tibial pulses are not palpable due to ankle swelling.Capillary return is immediate. Temperature gradient is WNL. Skin turgor WNL  Sensorium: Normal Semmes Weinstein monofilament test. Normal tactile sensation bilaterally. Nail Exam: Pt has thick disfigured discolored nails with subungual debris noted bilateral entire nail hallux through fifth toenails Ulcer Exam: There is no evidence of ulcer or pre-ulcerative changes or infection. Orthopedic Exam: Muscle tone and strength are WNL. No limitations in general ROM. No crepitus or effusions noted. Foot type and digits show no abnormalities. HAB  B/L.  Hammer toe  B/L. Skin: No Porokeratosis. No infection or ulcers  Diagnosis:  Onychomycosis, , Pain in right toe, pain in left toes  Treatment & Plan Procedures and Treatment: Consent by patient was obtained for treatment procedures.   Debridement of mycotic and hypertrophic toenails, 1 through 5 bilateral and clearing of subungual debris. No ulceration, no infection noted.  Return Visit-Office Procedure: Patient instructed to return to the office for a follow up visit 3 months for continued evaluation and treatment.    Helane GuntherGregory Parry Po DPM

## 2017-08-14 ENCOUNTER — Encounter: Payer: Self-pay | Admitting: Adult Health

## 2017-08-14 ENCOUNTER — Ambulatory Visit (INDEPENDENT_AMBULATORY_CARE_PROVIDER_SITE_OTHER): Payer: Medicare Other | Admitting: Adult Health

## 2017-08-14 VITALS — BP 169/83 | HR 81 | Wt 230.0 lb

## 2017-08-14 DIAGNOSIS — G459 Transient cerebral ischemic attack, unspecified: Secondary | ICD-10-CM | POA: Diagnosis not present

## 2017-08-14 DIAGNOSIS — E785 Hyperlipidemia, unspecified: Secondary | ICD-10-CM | POA: Diagnosis not present

## 2017-08-14 DIAGNOSIS — I10 Essential (primary) hypertension: Secondary | ICD-10-CM | POA: Diagnosis not present

## 2017-08-14 DIAGNOSIS — E1142 Type 2 diabetes mellitus with diabetic polyneuropathy: Secondary | ICD-10-CM

## 2017-08-14 MED ORDER — GABAPENTIN 300 MG PO CAPS: 300.0000 mg | ORAL_CAPSULE | Freq: Two times a day (BID) | ORAL | 3 refills | Status: DC

## 2017-08-14 MED ORDER — GABAPENTIN 300 MG PO CAPS: 300.0000 mg | ORAL_CAPSULE | Freq: Two times a day (BID) | ORAL | 3 refills | Status: AC

## 2017-08-14 NOTE — Patient Instructions (Addendum)
Continue clopidogrel 75 mg daily  and lipitor  for secondary stroke prevention  Continue to follow up with PCP regarding cholesterol and blood pressure management  Start Neurontin 300mg  twice a day - contact me after 2 weeks if you are tolerating well but continue to experience nerve pain and we an increase the dose. After 2 weeks if you are unable to tolerate side effects from medication such as fatigue or dizziness, call and we can decrease dose or change medications  Continue to stay active and maintain healthy diet  Continue to monitor blood pressure at home  Maintain strict control of hypertension with blood pressure goal below 130/90, diabetes with hemoglobin A1c goal below 6.5% and cholesterol with LDL cholesterol (bad cholesterol) goal below 70 mg/dL. I also advised the patient to eat a healthy diet with plenty of whole grains, cereals, fruits and vegetables, exercise regularly and maintain ideal body weight.  Followup in the future with me in 3 months or call earlier if needed          Thank you for coming [facility] see [facility] at [facility]. I hope we have been able to provide you high quality care today.  You may receive a patient satisfaction survey over the next few weeks. We would appreciate your feedback and comments so that we may continue to improve ourselves and the health of our patients.

## 2017-08-14 NOTE — Progress Notes (Signed)
NEUROLOGY CLINIC FOLLOW UP PATIENT NOTE  NAME: Carl Miller DOB: 23-Jul-1945 REFERRING PHYSICIAN: Barbie Banner, MD  I saw Carl Miller as a new consult in the neurovascular clinic today regarding  Chief Complaint  Patient presents with  . Follow-up    TIA follow up  patient with daughter Rene Kocher   .  HPI: Carl Miller is a 72 y.o. male with PMH of OSA on CPAP, HA, HTN, DM, HLD who presents as a new patient for episode of vertigo.   Pt went to ER on 12/05/16 for acute onset of room spinning dizziness while working in kitchen that morning. He turned to throw trash in to garbage bin and had sudden onset vertigo, room spinning, N/V, feeling sick, with double vision and slurry speech. No choking, no numbness, weakness, no HA or vision loss, but daughter said he looked pale and staggering on walking. EMS called and on arrival his BP was high but gradually coming down. The N/V lasted about one hour and dizziness gone in 7-8 hours. 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 Since then he had no recurrence.  Today he was sent here for further work up and evaluation.   He had PMH of HTN, DM and HLD. He is on amlodipine, Coreg, losartan, and chlorthalidone for BP control.  BP today 143/82.  He is also on Lantus and metformin for diabetes treatment.  He said he is sugar yesterday 195 and today 140.  He is on Lipitor 40 for HLD.  He also had a history of OSA, following with Dr. Vickey Huger, however, not compliant with CPAP.  He still has intermittent headache, 2-3 times per week, had a more behind left eye, squeezing and throbbing sensation lasting up day to several days.  Was taking ibuprofen in the past but discontinued due to potential kidney injury.  Currently taking Tylenol for abortive therapy.  He has not been put on any preventive therapy yet.  He quit smoking 20 years ago.  Occasional alcohol, denies  any illicit drug use.  Mom died of ICH at age of 75s.  05-31-2017 visit JX: During the interval time, pt has been doing well. No stroke like symptoms. 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 TTE done in Dr. Verl Dicker office, pending report. BP 129/65 today in clinic. As per pt daughter, his recent A1C 7.0 and his glucose at home 125-150s, occasionally at night goes to 250.   08/14/2017 update: Patient returns today for scheduled routine follow-up visit and is accompanied by his daughter.  From a TIA standpoint, patient has been doing well.  He did undergo 30-day cardiac monitoring which was negative for atrial fibrillation.  He continues to take Plavix without side effects of bleeding or bruising.  Continues to take Lipitor without side effects of myalgias.  Patient has not had lipid panel rechecked since 11/2016.  Blood pressure elevated at 169/83 but patient does monitor this at home, he states it is typically SBP 130-140.  Patient does have complaints of bilateral lower extremity numbness, tingling and nerve Miller.  He states within the past year this has been worsening to the point that it is making him difficult to ambulate and needs to have use of a cane due to balance issues.  Patient states he experiences the symptoms from his knee down to his toes  equally in both legs.  [facility]  Denies new or worsening stroke/TIA symptoms.     Past Medical History:  Diagnosis Date  . Diabetes mellitus (HCC)   . 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 GERD (gastroesophageal reflux disease)   . Headache   . Hyperlipidemia   . Hypertension   . Palpitations   . Stroke (HCC)   . Vision abnormalities   . Worsening headaches 01/28/2014   Past Surgical History:  Procedure Laterality Date  . BACK SURGERY    . DENTAL SURGERY    . LEFT HEART CATHETERIZATION WITH  CORONARY ANGIOGRAM N/A 01/13/2014   Procedure: LEFT HEART CATHETERIZATION WITH CORONARY ANGIOGRAM;  Surgeon: Pamella PertJagadeesh R Ganji, MD;  [facility];  Service: Cardiovascular;  Laterality: N/A;  . lithotomy    . SHOULDER SURGERY     Family History  Problem Relation Age of Onset  . Stroke Mother   . Diabetes Mother   . Heart attack Father    Current Outpatient Medications  Medication Sig Dispense Refill  . alendronate (FOSAMAX) 70 MG tablet Take 70 mg by mouth every Saturday. Take with a full glass of water on an empty stomach.    Marland Kitchen. amLODipine (NORVASC) 5 MG tablet Take 5 mg by mouth daily.    Marland Kitchen. atorvastatin (LIPITOR) 40 MG tablet Take 40 mg by mouth daily.    . B-D ULTRAFINE III SHORT PEN 31G X 8 MM MISC USE 1 AS DIRECTED 50 each 2  . carvedilol (COREG) 25 MG tablet Take 25 mg by mouth 2 (two) times daily with a meal.    . chlorthalidone (HYGROTON) 25 MG tablet Take 25 mg by mouth daily.    . clopidogrel (PLAVIX) 75 MG tablet Take 1 tablet (75 mg total) by mouth daily. 90 tablet 3  . Insulin Glargine (LANTUS SOLOSTAR) 100 UNIT/ML Solostar Pen Inject 30 Units into the skin at bedtime. 5 pen 2  . latanoprost (XALATAN) 0.005 % ophthalmic solution Place 1 drop into both eyes at bedtime.    Marland Kitchen. loratadine (CLARITIN) 10 MG tablet Take 10 mg daily by mouth.    . losartan (COZAAR) 100 MG tablet Take 100 mg by mouth daily.    . meclizine (ANTIVERT) 25 MG tablet Take 1 tablet (25 mg total) 3 (three) times daily as needed by mouth for dizziness. 30 tablet 0  . metFORMIN (GLUCOPHAGE) 500 MG tablet Take 1,000 mg 2 (two) times daily with a meal by mouth.     . Multiple Vitamin (MULTIVITAMIN WITH MINERALS) TABS tablet Take 1 tablet by mouth daily.    . pantoprazole (PROTONIX) 40 MG tablet Take 40 mg daily by mouth.     . spironolactone (ALDACTONE) 25 MG tablet Take by mouth.    . Vitamin D, Ergocalciferol, (DRISDOL) 50000 UNITS CAPS capsule Take 50,000 Units by mouth every Saturday.    .  gabapentin (NEURONTIN) 300 MG capsule Take 1 capsule (300 mg total) by mouth 2 (two) times daily. 60 capsule 3  . predniSONE (DELTASONE) 10 MG tablet TAKE 2 TABLETS BY MOUTH ONCE DAILY FOR 5 DAYS THEN 1 TABLET ONCE DAILY IN THE MORNING FOR 5 DAYS  0   No current facility-administered medications for this visit.    Allergies  Allergen Reactions  . Aspirin     Jittery, nausea when take "full dose".  . Tape Other (See Comments)    Latex tape -  pulls skin off.  NO latex allergy.   Social History   Socioeconomic History  . Marital status: Single    Spouse name: Not on file  . Number of children: Not on file  . Years of education: Not on file  . Highest education level: Not on file  Occupational History  . Not on file  Social Needs  . Financial resource strain: Not on file  . Food insecurity:    Worry: Not on file    Inability: Not on file  . Transportation needs:    Medical: Not on file    Non-medical: Not on file  Tobacco Use  . Smoking status: Former Smoker    Last attempt to quit: 01/18/1997    Years since quitting: 20.5  . Smokeless tobacco: Former Neurosurgeon    Quit date: 01/17/1997  Substance and Sexual Activity  . Alcohol use: No    Alcohol/week: 0.0 oz    Comment: quit 12/2012  . Drug use: No  . Sexual activity: Not on file  Lifestyle  . Physical activity:    Days per week: Not on file    Minutes per session: Not on file  . Stress: Not on file  Relationships  . Social connections:    Talks on phone: Not on file    Gets together: Not on file    Attends religious service: Not on file    Active member of club or organization: Not on file    Attends meetings of clubs or organizations: Not on file    Relationship status: Not on file  . Intimate partner violence:    Fear of current or ex partner: Not on file    Emotionally abused: Not on file    Physically abused: Not on file    Forced sexual activity: Not on file  Other Topics Concern  . Not on file  Social History  Narrative   Caffeine 30 ounces/ daily  (2 diet Mtn Dew/ week. Retired, lives alone.  Divorced.     Review of Systems Full 14 system review of systems performed and notable only for those listed, all others are neg:  Hearing loss, ringing in ears, cough, wheezing, leg swelling, back Miller, muscle cramps, walking difficulty, moles and itching   Physical Exam  Vitals:   08/14/17 1550  BP: (!) 169/83  Pulse: 81    General - Well nourished, well developed, pleasant elderly Caucasian male, in no apparent distress.  Ophthalmologic - Sharp disc margins OU.  Cardiovascular - Regular rate and rhythm.  Neck - supple, no nuchal rigidity .  Mental Status -  Level of arousal and orientation to time, place, and person were intact. Language including expression, naming, repetition, comprehension, reading, and writing was assessed and found intact. Fund of Knowledge was assessed and was intact.  Cranial Nerves II - XII - II - Visual field intact OU. III, IV, VI - Extraocular movements intact. V - Facial sensation intact bilaterally. VII - Facial movement intact bilaterally. VIII - Hard of hearing & vestibular intact bilaterally. X - Palate elevates symmetrically. XI - Chin turning & shoulder shrug intact bilaterally. XII - Tongue protrusion intact.  Motor Strength - The patient's strength was normal in all extremities and pronator drift was absent.  Bulk was normal and fasciculations were absent.   Motor Tone - Muscle tone was assessed at the neck and appendages and was normal.  Reflexes - The patient's reflexes were normal in all extremities and he had no pathological  reflexes.  Sensory - decreased vibratory and pinprick sensation on bilateral lower extremities distally  Coordination - The patient had normal movements in the hands and feet with no ataxia or dysmetria.  Tremor was absent.  Gait and Station - with use of cane, patient takes slow cautious steps   Imaging  I have  personally reviewed the radiological images below and agree with the radiology interpretations.  Mr Brain Wo Contrast 12/05/2016 IMPRESSION: 1. No acute intracranial abnormality. 2. Mild chronic small vessel ischemic disease. 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   CTA head and neck 03/23/17 IMPRESSION: 1. Intracranial atherosclerosis without large vessel occlusion. 2. Mild-to-moderate left cavernous ICA stenosis. 3. Cervical carotid artery atherosclerosis without stenosis. 4. 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 5. Patent and dominant left vertebral artery with mild stenosis proximally. 6. Aortic Atherosclerosis (ICD10-I70.0) and Emphysema (ICD10-J43.9). 7. Small chronic right frontal and right occipital infarcts.  Lab Review none   Assessment   In summary, RANFERI CLINGAN is a 72 y.o. male with PMH of HTN, HLD, DM, OSA, HA presents with episode of vertigo in 11/2016. He developed vertigo, double vision, slurry speech, imbalance at that time and symptom resolved in hours. MRI did not show new stroke but remote infarcts at right frontal, occiptal and left pontine. 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 TTE pending report. ASA changed to plavix. His episode in 11/2016 most likely due to posterior circulation TIA.   Plan: - continue plavix and lipitor for stroke prevention -Continue to follow with Dr. Joaquim Nam for HLD, HTN and DM -patient states he will be having an appointment with Dr. Andrey Campanile next week and will request lipid panel check at that time to ensure dosage of Lipitor adequately controlling HLD -Due to possible diabetic neuropathy, will start Neurontin 300 mg twice daily for neuropathy Miller -advised patient to call office after 2 weeks if patient unable to tolerate side effects such as fatigue or dizziness and we can consider decreasing dose or changing medications.   Also advised patient after 2 weeks if he is tolerating medication well without side effects but continues to not have relief with nerve Miller, we can consider increasing dose -Due to bilateral lower extremity edema, recommended continuation of compression stockings, routine elevations lower extremities and maintain healthy diet with minimal sodium intake -advised to continue to follow with Dr. Jacinto Halim as needed -Continue to stay active and maintain a healthy diet -Continue to monitor BP at home - Follow up with your primary care physician for stroke risk factor modification. Recommend maintain blood pressure goal <130/80, diabetes with hemoglobin A1c goal below 7.0% and lipids with LDL cholesterol goal below 70 mg/dL.  - diabetic diet and regular exercise  Follow-up in 3 months or call earlier as needed for follow-up of possible diabetic neuropathy  I spent more than 25 minutes of face to face time with the patient. Greater than 50% of time was spent in counseling and coordination of care. We discussed further stroke work up, BP management.   George Hugh, AGNP-BC  [facility] [address] [place], [place] [address]  Phone [phone] Fax [phone] Note: This document was prepared with digital dictation and possible smart phrase technology. Any transcriptional errors that result from this process are unintentional.

## 2017-08-23 ENCOUNTER — Telehealth: Payer: Self-pay | Admitting: Adult Health

## 2017-08-23 NOTE — Telephone Encounter (Signed)
Pt requesting a call to discuss gabapentin (NEURONTIN) 300 MG capsule. Did not wish to discuss further with me.

## 2017-08-23 NOTE — Telephone Encounter (Signed)
Noted! Thank you

## 2017-08-23 NOTE — Telephone Encounter (Signed)
Called the patient back and he states that he took the medication for about a week. He states that his legs became swollen and he had dizziness and couldn't focus with eyes. The patient states that he stopped taking due to these side effects. I asked if he wanted Shanda BumpsJessica to discuss potentially starting something different and the patient states that he would like to wait and discuss at his follow up. Informed him that I would make Shanda BumpsJessica aware that he has stopped the medication. Pt verbalized understanding.

## 2017-08-31 NOTE — Progress Notes (Signed)
I agree with the above plan 

## 2017-09-15 ENCOUNTER — Encounter: Payer: Self-pay | Admitting: Podiatry

## 2017-09-15 ENCOUNTER — Ambulatory Visit (INDEPENDENT_AMBULATORY_CARE_PROVIDER_SITE_OTHER): Payer: Medicare Other | Admitting: Podiatry

## 2017-09-15 DIAGNOSIS — E1142 Type 2 diabetes mellitus with diabetic polyneuropathy: Secondary | ICD-10-CM

## 2017-09-15 DIAGNOSIS — L608 Other nail disorders: Secondary | ICD-10-CM

## 2017-09-15 DIAGNOSIS — B351 Tinea unguium: Secondary | ICD-10-CM

## 2017-09-15 DIAGNOSIS — D689 Coagulation defect, unspecified: Secondary | ICD-10-CM

## 2017-09-15 NOTE — Progress Notes (Signed)
Complaint:  Visit Type: Patient returns to my office for continued preventative foot care services. Complaint: Patient states" my nails have grown long and thick and become painful to walk and wear shoes" Patient has been diagnosed with DM with no foot complications. The patient presents for preventative foot care services. No changes to ROS.  Patient is taking plavix.  Podiatric Exam: Vascular: dorsalis pedis  are palpable bilateral.  Posterior tibial pulses are not palpable due to ankle swelling.Capillary return is immediate. Temperature gradient is WNL. Skin turgor WNL  Sensorium: Normal Semmes Weinstein monofilament test. Normal tactile sensation bilaterally. Nail Exam: Pt has thick disfigured discolored nails with subungual debris noted bilateral entire nail hallux through fifth toenails Ulcer Exam: There is no evidence of ulcer or pre-ulcerative changes or infection. Orthopedic Exam: Muscle tone and strength are WNL. No limitations in general ROM. No crepitus or effusions noted. Foot type and digits show no abnormalities. HAB  B/L.  Hammer toe  B/L. Skin: No Porokeratosis. No infection or ulcers  Diagnosis:  Onychomycosis, , Pain in right toe, pain in left toes  Treatment & Plan Procedures and Treatment: Consent by patient was obtained for treatment procedures.   Debridement of mycotic and hypertrophic toenails, 1 through 5 bilateral and clearing of subungual debris. No ulceration, no infection noted. Nails were growing into medial and lateral distal corners right hallux.  Silvadene/DSD.  RTC 12 weeks. Return Visit-Office Procedure: Patient instructed to return to the office for a follow up visit 3 months for continued evaluation and treatment.    Helane GuntherGregory Tymika Grilli DPM

## 2017-11-15 ENCOUNTER — Ambulatory Visit: Payer: Medicare Other | Admitting: Adult Health

## 2017-12-08 ENCOUNTER — Encounter: Payer: Self-pay | Admitting: Podiatry

## 2017-12-08 ENCOUNTER — Ambulatory Visit (INDEPENDENT_AMBULATORY_CARE_PROVIDER_SITE_OTHER): Payer: Medicare Other | Admitting: Podiatry

## 2017-12-08 DIAGNOSIS — M79676 Pain in unspecified toe(s): Secondary | ICD-10-CM | POA: Diagnosis not present

## 2017-12-08 DIAGNOSIS — E1142 Type 2 diabetes mellitus with diabetic polyneuropathy: Secondary | ICD-10-CM

## 2017-12-08 DIAGNOSIS — L608 Other nail disorders: Secondary | ICD-10-CM

## 2017-12-08 DIAGNOSIS — D689 Coagulation defect, unspecified: Secondary | ICD-10-CM

## 2017-12-08 DIAGNOSIS — B351 Tinea unguium: Secondary | ICD-10-CM

## 2017-12-08 NOTE — Progress Notes (Signed)
Complaint:  Visit Type: Patient returns to my office for continued preventative foot care services. Complaint: Patient states" my nails have grown long and thick and become painful to walk and wear shoes" Patient has been diagnosed with DM with no foot complications. The patient presents for preventative foot care services. No changes to ROS.  Patient is taking plavix.  Podiatric Exam: Vascular: dorsalis pedis  are palpable bilateral.  Posterior tibial pulses are not palpable due to ankle swelling.Capillary return is immediate. Temperature gradient is WNL. Skin turgor WNL  Sensorium: Normal Semmes Weinstein monofilament test. Normal tactile sensation bilaterally. Nail Exam: Pt has thick disfigured discolored nails with subungual debris noted bilateral entire nail hallux through fifth toenails Ulcer Exam: There is no evidence of ulcer or pre-ulcerative changes or infection. Orthopedic Exam: Muscle tone and strength are WNL. No limitations in general ROM. No crepitus or effusions noted. Foot type and digits show no abnormalities. HAV  B/L.  Hammer toe  B/L. Skin: No Porokeratosis. No infection or ulcers  Diagnosis:  Onychomycosis, , Pain in right toe, pain in left toes  Treatment & Plan Procedures and Treatment: Consent by patient was obtained for treatment procedures.   Debridement of mycotic and hypertrophic toenails, 1 through 5 bilateral and clearing of subungual debris. No ulceration, no infection noted.   RTC 12 weeks. Return Visit-Office Procedure: Patient instructed to return to the office for a follow up visit 3 months for continued evaluation and treatment.    Avrielle Fry DPM 

## 2018-03-16 ENCOUNTER — Ambulatory Visit (INDEPENDENT_AMBULATORY_CARE_PROVIDER_SITE_OTHER): Payer: Medicare Other | Admitting: Podiatry

## 2018-03-16 ENCOUNTER — Encounter: Payer: Self-pay | Admitting: Podiatry

## 2018-03-16 DIAGNOSIS — L608 Other nail disorders: Secondary | ICD-10-CM

## 2018-03-16 DIAGNOSIS — B351 Tinea unguium: Secondary | ICD-10-CM

## 2018-03-16 DIAGNOSIS — M79676 Pain in unspecified toe(s): Secondary | ICD-10-CM | POA: Diagnosis not present

## 2018-03-16 DIAGNOSIS — D689 Coagulation defect, unspecified: Secondary | ICD-10-CM

## 2018-03-16 DIAGNOSIS — E1142 Type 2 diabetes mellitus with diabetic polyneuropathy: Secondary | ICD-10-CM

## 2018-03-16 NOTE — Progress Notes (Signed)
Complaint:  Visit Type: Patient returns to my office for continued preventative foot care services. Complaint: Patient states" my nails have grown long and thick and become painful to walk and wear shoes" Patient has been diagnosed with DM with no foot complications. The patient presents for preventative foot care services. No changes to ROS.  Patient is taking plavix.  Podiatric Exam: Vascular: dorsalis pedis  are palpable bilateral.  Posterior tibial pulses are not palpable due to ankle swelling.Capillary return is immediate. Temperature gradient is WNL. Skin turgor WNL  Sensorium: Normal Semmes Weinstein monofilament test. Normal tactile sensation bilaterally. Nail Exam: Pt has thick disfigured discolored nails with subungual debris noted bilateral entire nail hallux through fifth toenails Ulcer Exam: There is no evidence of ulcer or pre-ulcerative changes or infection. Orthopedic Exam: Muscle tone and strength are WNL. No limitations in general ROM. No crepitus or effusions noted. Foot type and digits show no abnormalities. HAV  B/L.  Hammer toe  B/L. Skin: No Porokeratosis. No infection or ulcers  Diagnosis:  Onychomycosis, , Pain in right toe, pain in left toes  Treatment & Plan Procedures and Treatment: Consent by patient was obtained for treatment procedures.   Debridement of mycotic and hypertrophic toenails, 1 through 5 bilateral and clearing of subungual debris. No ulceration, no infection noted.   RTC 12 weeks. Return Visit-Office Procedure: Patient instructed to return to the office for a follow up visit 3 months for continued evaluation and treatment.    Helane Gunther DPM

## 2018-06-15 ENCOUNTER — Ambulatory Visit: Payer: Medicare Other | Admitting: Podiatry

## 2018-07-13 ENCOUNTER — Ambulatory Visit (INDEPENDENT_AMBULATORY_CARE_PROVIDER_SITE_OTHER): Payer: Medicare Other | Admitting: Podiatry

## 2018-07-13 ENCOUNTER — Other Ambulatory Visit: Payer: Self-pay

## 2018-07-13 ENCOUNTER — Encounter: Payer: Self-pay | Admitting: Podiatry

## 2018-07-13 DIAGNOSIS — Z794 Long term (current) use of insulin: Secondary | ICD-10-CM

## 2018-07-13 DIAGNOSIS — B351 Tinea unguium: Secondary | ICD-10-CM

## 2018-07-13 DIAGNOSIS — M79675 Pain in left toe(s): Secondary | ICD-10-CM

## 2018-07-13 DIAGNOSIS — E1165 Type 2 diabetes mellitus with hyperglycemia: Secondary | ICD-10-CM

## 2018-07-13 DIAGNOSIS — M79674 Pain in right toe(s): Secondary | ICD-10-CM

## 2018-07-13 NOTE — Progress Notes (Signed)
Complaint:  Visit Type: Patient returns to my office for continued preventative foot care services. Complaint: Patient states" my nails have grown long and thick and become painful to walk and wear shoes" Patient has been diagnosed with DM with no foot complications. The patient presents for preventative foot care services. No changes to ROS.  Patient is taking plavix.  Podiatric Exam: Vascular: dorsalis pedis  are palpable bilateral.  Posterior tibial pulses are not palpable due to ankle swelling.Capillary return is immediate. Temperature gradient is WNL. Skin turgor WNL  Sensorium: Normal Semmes Weinstein monofilament test. Normal tactile sensation bilaterally. Nail Exam: Pt has thick disfigured discolored nails with subungual debris noted bilateral entire nail hallux through fifth toenails Ulcer Exam: There is no evidence of ulcer or pre-ulcerative changes or infection. Orthopedic Exam: Muscle tone and strength are WNL. No limitations in general ROM. No crepitus or effusions noted. Foot type and digits show no abnormalities. HAV  B/L.  Hammer toe  B/L. Skin: No Porokeratosis. No infection or ulcers  Diagnosis:  Onychomycosis, , Pain in right toe, pain in left toes  Treatment & Plan Procedures and Treatment: Consent by patient was obtained for treatment procedures.   Debridement of mycotic and hypertrophic toenails, 1 through 5 bilateral and clearing of subungual debris. No ulceration, no infection noted.  Patient has self avulsed fifth toenail left foot.  Toe is healing.   RTC 12 weeks. Return Visit-Office Procedure: Patient instructed to return to the office for a follow up visit 3 months for continued evaluation and treatment.    Gardiner Barefoot DPM

## 2018-07-27 ENCOUNTER — Inpatient Hospital Stay (HOSPITAL_COMMUNITY)
Admission: EM | Admit: 2018-07-27 | Discharge: 2018-08-01 | DRG: 291 | Disposition: A | Discharge status: Skilled Nursing Facility | Payer: Medicare Other | Attending: Internal Medicine | Admitting: Internal Medicine

## 2018-07-27 ENCOUNTER — Emergency Department (HOSPITAL_COMMUNITY): Payer: Medicare Other

## 2018-07-27 ENCOUNTER — Inpatient Hospital Stay (HOSPITAL_COMMUNITY): Payer: Medicare Other

## 2018-07-27 ENCOUNTER — Other Ambulatory Visit: Payer: Self-pay

## 2018-07-27 ENCOUNTER — Other Ambulatory Visit (HOSPITAL_COMMUNITY): Payer: Self-pay | Admitting: Family Medicine

## 2018-07-27 DIAGNOSIS — R0600 Dyspnea, unspecified: Secondary | ICD-10-CM

## 2018-07-27 DIAGNOSIS — E1142 Type 2 diabetes mellitus with diabetic polyneuropathy: Secondary | ICD-10-CM | POA: Diagnosis present

## 2018-07-27 DIAGNOSIS — J9602 Acute respiratory failure with hypercapnia: Secondary | ICD-10-CM | POA: Diagnosis present

## 2018-07-27 DIAGNOSIS — I10 Essential (primary) hypertension: Secondary | ICD-10-CM | POA: Diagnosis present

## 2018-07-27 DIAGNOSIS — J9691 Respiratory failure, unspecified with hypoxia: Secondary | ICD-10-CM | POA: Diagnosis present

## 2018-07-27 DIAGNOSIS — J9601 Acute respiratory failure with hypoxia: Secondary | ICD-10-CM | POA: Diagnosis present

## 2018-07-27 DIAGNOSIS — J9600 Acute respiratory failure, unspecified whether with hypoxia or hypercapnia: Secondary | ICD-10-CM

## 2018-07-27 DIAGNOSIS — T380X5A Adverse effect of glucocorticoids and synthetic analogues, initial encounter: Secondary | ICD-10-CM | POA: Diagnosis not present

## 2018-07-27 DIAGNOSIS — N179 Acute kidney failure, unspecified: Secondary | ICD-10-CM | POA: Diagnosis present

## 2018-07-27 DIAGNOSIS — R0902 Hypoxemia: Secondary | ICD-10-CM

## 2018-07-27 DIAGNOSIS — Z20828 Contact with and (suspected) exposure to other viral communicable diseases: Secondary | ICD-10-CM | POA: Diagnosis present

## 2018-07-27 DIAGNOSIS — R0602 Shortness of breath: Secondary | ICD-10-CM | POA: Diagnosis not present

## 2018-07-27 DIAGNOSIS — Z87891 Personal history of nicotine dependence: Secondary | ICD-10-CM

## 2018-07-27 DIAGNOSIS — Z978 Presence of other specified devices: Secondary | ICD-10-CM

## 2018-07-27 DIAGNOSIS — G4733 Obstructive sleep apnea (adult) (pediatric): Secondary | ICD-10-CM | POA: Diagnosis present

## 2018-07-27 DIAGNOSIS — Z833 Family history of diabetes mellitus: Secondary | ICD-10-CM | POA: Diagnosis not present

## 2018-07-27 DIAGNOSIS — Z823 Family history of stroke: Secondary | ICD-10-CM | POA: Diagnosis not present

## 2018-07-27 DIAGNOSIS — Z8249 Family history of ischemic heart disease and other diseases of the circulatory system: Secondary | ICD-10-CM

## 2018-07-27 DIAGNOSIS — Z91048 Other nonmedicinal substance allergy status: Secondary | ICD-10-CM

## 2018-07-27 DIAGNOSIS — R680 Hypothermia, not associated with low environmental temperature: Secondary | ICD-10-CM | POA: Diagnosis not present

## 2018-07-27 DIAGNOSIS — E669 Obesity, unspecified: Secondary | ICD-10-CM | POA: Diagnosis present

## 2018-07-27 DIAGNOSIS — R451 Restlessness and agitation: Secondary | ICD-10-CM | POA: Diagnosis not present

## 2018-07-27 DIAGNOSIS — Z7983 Long term (current) use of bisphosphonates: Secondary | ICD-10-CM

## 2018-07-27 DIAGNOSIS — E1122 Type 2 diabetes mellitus with diabetic chronic kidney disease: Secondary | ICD-10-CM | POA: Diagnosis present

## 2018-07-27 DIAGNOSIS — E1165 Type 2 diabetes mellitus with hyperglycemia: Secondary | ICD-10-CM | POA: Diagnosis present

## 2018-07-27 DIAGNOSIS — J9690 Respiratory failure, unspecified, unspecified whether with hypoxia or hypercapnia: Secondary | ICD-10-CM

## 2018-07-27 DIAGNOSIS — Z8673 Personal history of transient ischemic attack (TIA), and cerebral infarction without residual deficits: Secondary | ICD-10-CM | POA: Diagnosis not present

## 2018-07-27 DIAGNOSIS — N189 Chronic kidney disease, unspecified: Secondary | ICD-10-CM | POA: Diagnosis present

## 2018-07-27 DIAGNOSIS — E785 Hyperlipidemia, unspecified: Secondary | ICD-10-CM | POA: Diagnosis present

## 2018-07-27 DIAGNOSIS — J690 Pneumonitis due to inhalation of food and vomit: Secondary | ICD-10-CM | POA: Diagnosis present

## 2018-07-27 DIAGNOSIS — I5033 Acute on chronic diastolic (congestive) heart failure: Secondary | ICD-10-CM | POA: Diagnosis present

## 2018-07-27 DIAGNOSIS — Z79899 Other long term (current) drug therapy: Secondary | ICD-10-CM

## 2018-07-27 DIAGNOSIS — Z6833 Body mass index (BMI) 33.0-33.9, adult: Secondary | ICD-10-CM

## 2018-07-27 DIAGNOSIS — K219 Gastro-esophageal reflux disease without esophagitis: Secondary | ICD-10-CM | POA: Diagnosis present

## 2018-07-27 DIAGNOSIS — Z7902 Long term (current) use of antithrombotics/antiplatelets: Secondary | ICD-10-CM

## 2018-07-27 DIAGNOSIS — Z5329 Procedure and treatment not carried out because of patient's decision for other reasons: Secondary | ICD-10-CM | POA: Diagnosis not present

## 2018-07-27 DIAGNOSIS — I130 Hypertensive heart and chronic kidney disease with heart failure and stage 1 through stage 4 chronic kidney disease, or unspecified chronic kidney disease: Secondary | ICD-10-CM | POA: Diagnosis present

## 2018-07-27 DIAGNOSIS — H409 Unspecified glaucoma: Secondary | ICD-10-CM | POA: Diagnosis present

## 2018-07-27 DIAGNOSIS — IMO0002 Reserved for concepts with insufficient information to code with codable children: Secondary | ICD-10-CM | POA: Diagnosis present

## 2018-07-27 DIAGNOSIS — Z794 Long term (current) use of insulin: Secondary | ICD-10-CM

## 2018-07-27 DIAGNOSIS — Z886 Allergy status to analgesic agent status: Secondary | ICD-10-CM

## 2018-07-27 DIAGNOSIS — Z9119 Patient's noncompliance with other medical treatment and regimen: Secondary | ICD-10-CM

## 2018-07-27 DIAGNOSIS — R001 Bradycardia, unspecified: Secondary | ICD-10-CM | POA: Diagnosis not present

## 2018-07-27 DIAGNOSIS — I509 Heart failure, unspecified: Secondary | ICD-10-CM

## 2018-07-27 LAB — URINALYSIS, ROUTINE W REFLEX MICROSCOPIC
Bilirubin Urine: NEGATIVE
Glucose, UA: 150 mg/dL — AB
Hgb urine dipstick: NEGATIVE
Ketones, ur: NEGATIVE mg/dL
Leukocytes,Ua: NEGATIVE
Nitrite: NEGATIVE
Protein, ur: >=300 mg/dL — AB
Specific Gravity, Urine: 1.012 (ref 1.005–1.030)
pH: 5 (ref 5.0–8.0)

## 2018-07-27 LAB — BRAIN NATRIURETIC PEPTIDE: B Natriuretic Peptide: 348 pg/mL — ABNORMAL HIGH (ref 0.0–100.0)

## 2018-07-27 LAB — CBC WITH DIFFERENTIAL/PLATELET
Abs Immature Granulocytes: 0.1 10*3/uL — ABNORMAL HIGH (ref 0.00–0.07)
Basophils Absolute: 0 10*3/uL (ref 0.0–0.1)
Basophils Relative: 0 %
Eosinophils Absolute: 0.1 10*3/uL (ref 0.0–0.5)
Eosinophils Relative: 1 %
HCT: 36.3 % — ABNORMAL LOW (ref 39.0–52.0)
Hemoglobin: 10.5 g/dL — ABNORMAL LOW (ref 13.0–17.0)
Immature Granulocytes: 1 %
Lymphocytes Relative: 12 %
Lymphs Abs: 1.2 10*3/uL (ref 0.7–4.0)
MCH: 28.5 pg (ref 26.0–34.0)
MCHC: 28.9 g/dL — ABNORMAL LOW (ref 30.0–36.0)
MCV: 98.4 fL (ref 80.0–100.0)
Monocytes Absolute: 0.9 10*3/uL (ref 0.1–1.0)
Monocytes Relative: 9 %
Neutro Abs: 7.7 10*3/uL (ref 1.7–7.7)
Neutrophils Relative %: 77 %
Platelets: 195 10*3/uL (ref 150–400)
RBC: 3.69 MIL/uL — ABNORMAL LOW (ref 4.22–5.81)
RDW: 14.7 % (ref 11.5–15.5)
WBC: 10 10*3/uL (ref 4.0–10.5)
nRBC: 0.3 % — ABNORMAL HIGH (ref 0.0–0.2)

## 2018-07-27 LAB — TROPONIN I (HIGH SENSITIVITY)
Troponin I (High Sensitivity): 19 ng/L — ABNORMAL HIGH (ref <18)
Troponin I (High Sensitivity): 19 ng/L — ABNORMAL HIGH (ref <18)

## 2018-07-27 LAB — ECHOCARDIOGRAM COMPLETE
Height: 72 in
Weight: 3920 oz

## 2018-07-27 LAB — GLUCOSE, CAPILLARY
Glucose-Capillary: 124 mg/dL — ABNORMAL HIGH (ref 70–99)
Glucose-Capillary: 207 mg/dL — ABNORMAL HIGH (ref 70–99)
Glucose-Capillary: 163 mg/dL — ABNORMAL HIGH (ref 70–99)

## 2018-07-27 LAB — BASIC METABOLIC PANEL
Anion gap: 7 (ref 5–15)
BUN: 55 mg/dL — ABNORMAL HIGH (ref 8–23)
CO2: 24 mmol/L (ref 22–32)
Calcium: 8.6 mg/dL — ABNORMAL LOW (ref 8.9–10.3)
Chloride: 111 mmol/L (ref 98–111)
Creatinine, Ser: 2.6 mg/dL — ABNORMAL HIGH (ref 0.61–1.24)
GFR calc Af Amer: 27 mL/min — ABNORMAL LOW (ref >60)
GFR calc non Af Amer: 23 mL/min — ABNORMAL LOW (ref >60)
Glucose, Bld: 159 mg/dL — ABNORMAL HIGH (ref 70–99)
Potassium: 4.5 mmol/L (ref 3.5–5.1)
Sodium: 142 mmol/L (ref 135–145)

## 2018-07-27 LAB — HEMOGLOBIN A1C
Hgb A1c MFr Bld: 7.6 % — ABNORMAL HIGH (ref 4.8–5.6)
Mean Plasma Glucose: 171.42 mg/dL

## 2018-07-27 LAB — SARS CORONAVIRUS 2 BY RT PCR (HOSPITAL ORDER, PERFORMED IN ~~LOC~~ HOSPITAL LAB): SARS Coronavirus 2: NEGATIVE

## 2018-07-27 MED ORDER — LATANOPROST 0.005 % OP SOLN
1.0000 [drp] | Freq: Every day | OPHTHALMIC | Status: DC
  Administered 2018-07-27 – 2018-07-31 (×5): 1 [drp] via OPHTHALMIC
  Filled 2018-07-27 (×2): qty 2.5

## 2018-07-27 MED ORDER — FUROSEMIDE 10 MG/ML IJ SOLN
40.0000 mg | Freq: Once | INTRAMUSCULAR | Status: AC
  Administered 2018-07-27: 40 mg via INTRAVENOUS
  Filled 2018-07-27: qty 4

## 2018-07-27 MED ORDER — HYDRALAZINE HCL 20 MG/ML IJ SOLN
10.0000 mg | Freq: Four times a day (QID) | INTRAMUSCULAR | Status: DC | PRN
  Administered 2018-07-30: 10 mg via INTRAVENOUS
  Filled 2018-07-27: qty 1

## 2018-07-27 MED ORDER — INSULIN ASPART 100 UNIT/ML ~~LOC~~ SOLN
0.0000 [IU] | Freq: Every day | SUBCUTANEOUS | Status: DC
  Administered 2018-07-27: 3 [IU] via SUBCUTANEOUS

## 2018-07-27 MED ORDER — MECLIZINE HCL 12.5 MG PO TABS: 25.0000 mg | ORAL_TABLET | Freq: Three times a day (TID) | ORAL | Status: DC | PRN

## 2018-07-27 MED ORDER — PANTOPRAZOLE SODIUM 40 MG PO TBEC: 40.0000 mg | DELAYED_RELEASE_TABLET | Freq: Every day | ORAL | Status: DC

## 2018-07-27 MED ORDER — VITAMIN D (ERGOCALCIFEROL) 1.25 MG (50000 UNIT) PO CAPS
50000.0000 [IU] | ORAL_CAPSULE | ORAL | Status: DC
  Filled 2018-07-27: qty 1

## 2018-07-27 MED ORDER — SPIRONOLACTONE 25 MG PO TABS
12.5000 mg | ORAL_TABLET | Freq: Every day | ORAL | Status: DC
  Filled 2018-07-27 (×3): qty 0.5

## 2018-07-27 MED ORDER — ALENDRONATE SODIUM 70 MG PO TABS: 70.0000 mg | ORAL_TABLET | ORAL | Status: DC

## 2018-07-27 MED ORDER — METOLAZONE 5 MG PO TABS
5.0000 mg | ORAL_TABLET | Freq: Once | ORAL | Status: DC
  Filled 2018-07-27: qty 1

## 2018-07-27 MED ORDER — GLIPIZIDE 5 MG PO TABS
5.0000 mg | ORAL_TABLET | Freq: Two times a day (BID) | ORAL | Status: DC
  Administered 2018-07-27: 5 mg via ORAL
  Filled 2018-07-27: qty 1

## 2018-07-27 MED ORDER — FUROSEMIDE 10 MG/ML IJ SOLN
60.0000 mg | Freq: Two times a day (BID) | INTRAMUSCULAR | Status: DC
  Administered 2018-07-27 – 2018-08-01 (×10): 60 mg via INTRAVENOUS
  Filled 2018-07-27 (×10): qty 6

## 2018-07-27 MED ORDER — GABAPENTIN 300 MG PO CAPS
300.0000 mg | ORAL_CAPSULE | Freq: Two times a day (BID) | ORAL | Status: DC
  Administered 2018-07-27: 22:00:00 300 mg via ORAL
  Filled 2018-07-27: qty 1

## 2018-07-27 MED ORDER — ONDANSETRON HCL 4 MG PO TABS: 4.0000 mg | ORAL_TABLET | Freq: Four times a day (QID) | ORAL | Status: DC | PRN

## 2018-07-27 MED ORDER — HEPARIN SODIUM (PORCINE) 5000 UNIT/ML IJ SOLN
5000.0000 [IU] | Freq: Three times a day (TID) | INTRAMUSCULAR | Status: DC
  Administered 2018-07-27 – 2018-08-01 (×15): 5000 [IU] via SUBCUTANEOUS
  Filled 2018-07-27 (×15): qty 1

## 2018-07-27 MED ORDER — LOSARTAN POTASSIUM 25 MG PO TABS: 100.0000 mg | ORAL_TABLET | Freq: Every day | ORAL | Status: DC

## 2018-07-27 MED ORDER — POLYETHYLENE GLYCOL 3350 17 G PO PACK: 17.0000 g | PACK | Freq: Every day | ORAL | Status: DC | PRN

## 2018-07-27 MED ORDER — CLOPIDOGREL BISULFATE 75 MG PO TABS: 75.0000 mg | ORAL_TABLET | Freq: Every day | ORAL | Status: DC

## 2018-07-27 MED ORDER — TRAZODONE HCL 50 MG PO TABS: 50.0000 mg | ORAL_TABLET | Freq: Every evening | ORAL | Status: DC | PRN

## 2018-07-27 MED ORDER — INSULIN ASPART 100 UNIT/ML ~~LOC~~ SOLN
0.0000 [IU] | Freq: Three times a day (TID) | SUBCUTANEOUS | Status: DC
  Administered 2018-07-27 – 2018-07-28 (×2): 5 [IU] via SUBCUTANEOUS
  Administered 2018-07-28: 3 [IU] via SUBCUTANEOUS

## 2018-07-27 MED ORDER — ALBUTEROL SULFATE (2.5 MG/3ML) 0.083% IN NEBU: 2.5000 mg | INHALATION_SOLUTION | RESPIRATORY_TRACT | Status: DC | PRN

## 2018-07-27 MED ORDER — CIPROFLOXACIN-DEXAMETHASONE 0.3-0.1 % OT SUSP: 4.0000 [drp] | Freq: Two times a day (BID) | OTIC | Status: DC

## 2018-07-27 MED ORDER — ACETAMINOPHEN 325 MG PO TABS
650.0000 mg | ORAL_TABLET | Freq: Four times a day (QID) | ORAL | Status: DC | PRN
  Administered 2018-07-28: 650 mg via ORAL
  Filled 2018-07-27: qty 2

## 2018-07-27 MED ORDER — INSULIN GLARGINE 100 UNIT/ML ~~LOC~~ SOLN
30.0000 [IU] | Freq: Every day | SUBCUTANEOUS | Status: DC
  Administered 2018-07-27: 30 [IU] via SUBCUTANEOUS
  Filled 2018-07-27 (×3): qty 0.3

## 2018-07-27 MED ORDER — SODIUM CHLORIDE 0.9% FLUSH
3.0000 mL | Freq: Two times a day (BID) | INTRAVENOUS | Status: DC
  Administered 2018-07-27 – 2018-07-31 (×9): 3 mL via INTRAVENOUS

## 2018-07-27 MED ORDER — ATORVASTATIN CALCIUM 40 MG PO TABS: 40.0000 mg | ORAL_TABLET | Freq: Every day | ORAL | Status: DC

## 2018-07-27 MED ORDER — SODIUM CHLORIDE 0.9% FLUSH: 3.0000 mL | INTRAVENOUS | Status: DC | PRN

## 2018-07-27 MED ORDER — METFORMIN HCL 500 MG PO TABS: 1000.0000 mg | ORAL_TABLET | Freq: Two times a day (BID) | ORAL | Status: DC

## 2018-07-27 MED ORDER — AMLODIPINE BESYLATE 5 MG PO TABS: 5.0000 mg | ORAL_TABLET | Freq: Every day | ORAL | Status: DC

## 2018-07-27 MED ORDER — ADULT MULTIVITAMIN W/MINERALS CH: 1.0000 | ORAL_TABLET | Freq: Every day | ORAL | Status: DC

## 2018-07-27 MED ORDER — CARVEDILOL 12.5 MG PO TABS
25.0000 mg | ORAL_TABLET | Freq: Two times a day (BID) | ORAL | Status: DC
  Administered 2018-07-27: 25 mg via ORAL
  Filled 2018-07-27: qty 2

## 2018-07-27 MED ORDER — ACETAMINOPHEN 650 MG RE SUPP: 650.0000 mg | Freq: Four times a day (QID) | RECTAL | Status: DC | PRN

## 2018-07-27 MED ORDER — ONDANSETRON HCL 4 MG/2ML IJ SOLN: 4.0000 mg | Freq: Four times a day (QID) | INTRAMUSCULAR | Status: DC | PRN

## 2018-07-27 MED ORDER — SODIUM CHLORIDE 0.9 % IV SOLN
250.0000 mL | INTRAVENOUS | Status: DC | PRN
  Administered 2018-07-28: 250 mL via INTRAVENOUS

## 2018-07-27 NOTE — Care Management Important Message (Signed)
Important Message  Patient Details  Name: Carl Miller MRN: [id number] Date of Birth: [birth year]   Medicare Important Message Given:  Yes     Tommy Medal 07/27/2018, 1:00 PM

## 2018-07-27 NOTE — Progress Notes (Signed)
Patient is refusing the use of CPAP. Patient states he use to wear one but is unable to tolerate. RT educated patient and informed patient to have RN contact RT if he changes his mind.

## 2018-07-27 NOTE — ED Provider Notes (Signed)
[facility] EMERGENCY DEPARTMENT Provider Note   CSN: [id number] Arrival date & time: 07/27/18  5809     History   Chief Complaint Chief Complaint  Patient presents with   Shortness of Breath    HPI Carl Miller is a 73 y.o. male.     HPI  Pt was seen at [facility]. Per pt, c/o gradual onset and worsening of persistent SOB for the past 1 month. Has been associated with increasing pedal edema, and weight gain of "20#." SOB worsens with laying down. Pt's O2 Sats on arrival to ED were "65% R/A," which increased to "89% on O2 5L N/C." Denies CP/palpitations, no cough, no fever, no rash, no abd pain, no N/V/D, no focal motor weakness, no tingling/numbness in extremities.   Past Medical History:  Diagnosis Date   Diabetes mellitus ([place])    Diabetes mellitus due to underlying condition with diabetic polyneuropathy ([place]) 01/28/2014   GERD (gastroesophageal reflux disease)    Headache    Hyperlipidemia    Hypertension    Palpitations    Stroke [facility])    Vision abnormalities    Worsening headaches 01/28/2014    Patient Active Problem List   Diagnosis Date Noted   Pain due to onychomycosis of toenails of both feet 07/13/2018   Intracranial vascular stenosis 05/18/2017   TIA (transient ischemic attack) 03/07/2017   Type 2 diabetes mellitus with hyperglycemia ([place]) 09/16/2016   Arthritis 05/05/2015   Chronic mastoiditis of both sides 05/05/2015   Generalized muscle weakness 05/05/2015   GERD (gastroesophageal reflux disease) 05/05/2015   Glaucoma 05/05/2015   Hearing loss 05/05/2015   Hypertension 05/05/2015   Migraine headache 05/05/2015   Situational depression 05/05/2015   Venous stasis 05/05/2015   Hyperlipidemia 11/17/2014   Essential (primary) hypertension 11/17/2014   Venous intermittent claudication    Bilateral leg edema    OSA (obstructive sleep apnea) 05/23/2014   Hypoxemia 05/23/2014   Worsening headaches 01/28/2014   Type 2  diabetes mellitus with hyperglycemia, with long-term current use of insulin ([place]) 01/28/2014   Snoring 01/28/2014   Cluster headache 01/28/2014   Nasal congestion with rhinorrhea 01/28/2014   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/11/2014   Uncontrolled type 2 diabetes mellitus with chronic kidney disease ([place]) 01/11/2014   Abnormal nuclear stress test 12/16/2013    Past Surgical History:  Procedure Laterality Date   BACK SURGERY     DENTAL SURGERY     LEFT HEART CATHETERIZATION WITH CORONARY ANGIOGRAM N/A 01/13/2014   Procedure: LEFT HEART CATHETERIZATION WITH CORONARY ANGIOGRAM;  Surgeon: Laverda Page, MD;  Location: [facility] CATH LAB;  Service: Cardiovascular;  Laterality: N/A;   lithotomy     SHOULDER SURGERY          Home Medications    Prior to Admission medications   Medication Sig Start Date End Date Taking? Authorizing Provider  albuterol (PROVENTIL HFA;VENTOLIN HFA) 108 (90 Base) MCG/ACT inhaler Inhale into the lungs. 10/13/16   [provider]  alendronate (FOSAMAX) 70 MG tablet Take 70 mg by mouth every Saturday. Take with a full glass of water on an empty stomach.    [provider]  amLODipine (NORVASC) 5 MG tablet Take 5 mg by mouth daily.    [provider]  atorvastatin (LIPITOR) 40 MG tablet Take 40 mg by mouth daily.    [provider]  B-D ULTRAFINE III SHORT PEN 31G X 8 MM MISC USE 1 AS DIRECTED 01/13/15   Nida, Marella Chimes, MD  carvedilol (COREG) 25  MG tablet Take 25 mg by mouth 2 (two) times daily with a meal.    [provider]  cephALEXin (KEFLEX) 500 MG capsule TAKE 1 CAPSULE BY MOUTH THREE TIMES DAILY FOR 10 DAYS 07/09/18   [provider]  chlorthalidone (HYGROTON) 25 MG tablet Take 25 mg by mouth daily.    [provider]  ciprofloxacin-dexamethasone (CIPRODEX) OTIC suspension INSTILL 4 DROPS INTO RIGHT EAR TWICE DAILY 06/29/18   [provider]  clopidogrel (PLAVIX) 75 MG tablet Take 1 tablet  (75 mg total) by mouth daily. 03/07/17   Marvel PlanXu, [name], MD  doxycycline (VIBRA-TABS) 100 MG tablet Take one tablet twice a day, with food, for respiratory infection 08/07/17   [provider]  gabapentin (NEURONTIN) 300 MG capsule Take 1 capsule (300 mg total) by mouth 2 (two) times daily. 08/14/17   George HughVanschaick, Jessica, NP  glipiZIDE (GLUCOTROL) 5 MG tablet Take by mouth.    [provider]  Icosapent Ethyl 1 g CAPS Take by mouth.    [provider]  Insulin Glargine (LANTUS SOLOSTAR) 100 UNIT/ML Solostar Pen Inject 30 Units into the skin at bedtime. 08/18/15   Roma KayserNida, Gebreselassie W, MD  latanoprost (XALATAN) 0.005 % ophthalmic solution Place 1 drop into both eyes at bedtime.    [provider]  levofloxacin (LEVAQUIN) 500 MG tablet TAKE 1 TABLET BY MOUTH ONCE DAILY FOR 10 DAYS 12/22/17   [provider]  loratadine (CLARITIN) 10 MG tablet Take 10 mg daily by mouth.    [provider]  losartan (COZAAR) 100 MG tablet Take 100 mg by mouth daily.    [provider]  meclizine (ANTIVERT) 25 MG tablet Take 1 tablet (25 mg total) 3 (three) times daily as needed by mouth for dizziness. 12/05/16   Vanetta MuldersZackowski, Scott, MD  metFORMIN (GLUCOPHAGE) 500 MG tablet Take 1,000 mg 2 (two) times daily with a meal by mouth.     [provider]  Multiple Vitamin (MULTIVITAMIN WITH MINERALS) TABS tablet Take 1 tablet by mouth daily.    [provider]  pantoprazole (PROTONIX) 40 MG tablet Take 40 mg daily by mouth.  01/02/14   [provider]  predniSONE (DELTASONE) 10 MG tablet TAKE 2 TABLETS BY MOUTH ONCE DAILY FOR 5 DAYS THEN 1 TABLET ONCE DAILY IN THE MORNING FOR 5 DAYS 08/07/17   [provider]  spironolactone (ALDACTONE) 25 MG tablet Take by mouth.    [provider]  triamcinolone ointment (KENALOG) 0.1 % APPLY OINTMENT TOPICALLY TO AFFECTED AREA TWICE DAILY 07/09/18   [provider]  Vitamin D,  Ergocalciferol, (DRISDOL) 50000 UNITS CAPS capsule Take 50,000 Units by mouth every Saturday.    [provider]    Family History Family History  Problem Relation Age of Onset   Stroke Mother    Diabetes Mother    Heart attack Father     Social History Social History   Tobacco Use   Smoking status: Former Smoker    Quit date: 01/18/1997    Years since quitting: 21.5   Smokeless tobacco: Former NeurosurgeonUser    Quit date: 01/17/1997  Substance Use Topics   Alcohol use: No    Alcohol/week: 0.0 standard drinks    Comment: quit 12/2012   Drug use: No     Allergies   Aspirin and Tape   Review of Systems Review of Systems ROS: Statement: All systems negative except as marked or noted in the HPI; Constitutional: Negative for fever and chills. ; ;  Eyes: Negative for eye pain, redness and discharge. ; ; ENMT: Negative for ear pain, hoarseness, nasal congestion, sinus pressure and sore throat. ; ; Cardiovascular: Negative for chest pain, palpitations, diaphoresis, +dyspnea and peripheral edema. ; ; Respiratory: Negative for cough, wheezing and stridor. ; ; Gastrointestinal: Negative for nausea, vomiting, diarrhea, abdominal pain, blood in stool, hematemesis, jaundice and rectal bleeding. . ; ; Genitourinary: Negative for dysuria, flank pain and hematuria. ; ; Musculoskeletal: Negative for back pain and neck pain. Negative for swelling and trauma.; ; Skin: Negative for pruritus, rash, abrasions, blisters, bruising and skin lesion.; ; Neuro: Negative for headache, lightheadedness and neck stiffness. Negative for weakness, altered level of consciousness, altered mental status, extremity weakness, paresthesias, involuntary movement, seizure and syncope.       Physical Exam Updated Vital Signs BP 124/80 (BP Location: Right Arm)    Pulse 76    Temp 98.1 F (36.7 C) (Oral)    Resp 20    Ht 6' (1.829 m)    Wt 111.1 kg    SpO2 100%    BMI 33.23 kg/m   Patient Vitals for the past 24  hrs:  BP Temp Temp src Pulse Resp SpO2 Height Weight  07/27/18 0938 124/80 -- -- 76 20 100 % -- --  07/27/18 0830 (!) 150/101 -- -- 73 -- 92 % -- --  07/27/18 0818 -- -- -- -- -- -- 6' (1.829 m) 111.1 kg  07/27/18 0813 (!) 165/86 98.1 F (36.7 C) Oral 89 (!) 22 95 % -- --  07/27/18 0812 -- -- -- -- -- (!) 65 % -- --      Physical Exam 0825: Physical examination:  Nursing notes reviewed; Vital signs and O2 SAT reviewed;  Constitutional: Well developed, Well nourished, Well hydrated, In no acute distress; Head:  Normocephalic, atraumatic; Eyes: EOMI, PERRL, No scleral icterus; ENMT: Mouth and pharynx normal, Mucous membranes moist; Neck: Supple, Full range of motion, No lymphadenopathy; Cardiovascular: Regular rate and rhythm, No gallop; Respiratory: Breath sounds coarse & equal bilaterally, No wheezes.  Speaking full sentences with ease, Normal respiratory effort/excursion; Chest: Nontender, Movement normal; Abdomen: Soft, Nontender, Nondistended, Normal bowel sounds; Genitourinary: No CVA tenderness; Extremities: Peripheral pulses normal, No tenderness, +3 pedal edema feet to knees bilat. No calf tenderness or asymmetry.; Neuro: AA&Ox3, Major CN grossly intact.  Speech clear. No gross focal motor or sensory deficits in extremities.; Skin: Color normal, Warm, Dry.    ED Treatments / Results  Labs (all labs ordered are listed, but only abnormal results are displayed)   EKG EKG Interpretation  Date/Time:  Friday July 27 2018 08:24:56 EDT Ventricular Rate:  73 PR Interval:    QRS Duration: 114 QT Interval:  381 QTC Calculation: 420 R Axis:   -59 Text Interpretation:  Sinus rhythm Ventricular premature complex Borderline IVCD with LAD Abnormal R-wave progression, late transition Inferior infarct, old Baseline wander Artifact When compared with ECG of 12/05/2016 No significant change was found Confirmed by Samuel JesterMcManus, Nayelli Inglis [phone]) on 07/27/2018 9:09:43  AM   Radiology   Procedures Procedures (including critical care time)  Medications Ordered in ED Medications  furosemide (LASIX) injection 40 mg (40 mg Intravenous Given 07/27/18 0936)     Initial Impression / Assessment and Plan / ED Course  I have reviewed the triage vital signs and the nursing notes.  Pertinent labs & imaging results that were available during my care of the patient were reviewed by me and considered in my medical decision making (see chart  for details).     MDM Reviewed: previous chart, nursing note and vitals Reviewed previous: labs and ECG Interpretation: labs, ECG and x-ray Total time providing critical care: 30-74 minutes. This excludes time spent performing separately reportable procedures and services. Consults: admitting MD   CRITICAL CARE Performed by: Samuel Jester Total critical care time: 35 minutes Critical care time was exclusive of separately billable procedures and treating other patients. Critical care was necessary to treat or prevent imminent or life-threatening deterioration. Critical care was time spent personally by me on the following activities: development of treatment plan with patient and/or surrogate as well as nursing, discussions with consultants, evaluation of patient's response to treatment, examination of patient, obtaining history from patient or surrogate, ordering and performing treatments and interventions, ordering and review of laboratory studies, ordering and review of radiographic studies, pulse oximetry and re-evaluation of patient's condition.   Results for orders placed or performed during the hospital encounter of 07/27/18  SARS Coronavirus 2 (CEPHEID - Performed in [facility] hospital lab), [facility] Order   Specimen: Nasopharyngeal Swab  Result Value Ref Range   SARS Coronavirus 2 NEGATIVE NEGATIVE  Basic metabolic panel  Result Value Ref Range   Sodium 142 135 - 145 mmol/L   Potassium 4.5 3.5 - 5.1 mmol/L    Chloride 111 98 - 111 mmol/L   CO2 24 22 - 32 mmol/L   Glucose, Bld 159 (H) 70 - 99 mg/dL   BUN 55 (H) 8 - 23 mg/dL   Creatinine, Ser 5.78 (H) 0.61 - 1.24 mg/dL   Calcium 8.6 (L) 8.9 - 10.3 mg/dL   GFR calc non Af Amer 23 (L) >60 mL/min   GFR calc Af Amer 27 (L) >60 mL/min   Anion gap 7 5 - 15  Brain natriuretic peptide  Result Value Ref Range   B Natriuretic Peptide 348.0 (H) 0.0 - 100.0 pg/mL  CBC with Differential  Result Value Ref Range   WBC 10.0 4.0 - 10.5 K/uL   RBC 3.69 (L) 4.22 - 5.81 MIL/uL   Hemoglobin 10.5 (L) 13.0 - 17.0 g/dL   HCT 46.9 (L) 62.9 - 52.8 %   MCV 98.4 80.0 - 100.0 fL   MCH 28.5 26.0 - 34.0 pg   MCHC 28.9 (L) 30.0 - 36.0 g/dL   RDW 41.3 24.4 - 01.0 %   Platelets 195 150 - 400 K/uL   nRBC 0.3 (H) 0.0 - 0.2 %   Neutrophils Relative % 77 %   Neutro Abs 7.7 1.7 - 7.7 K/uL   Lymphocytes Relative 12 %   Lymphs Abs 1.2 0.7 - 4.0 K/uL   Monocytes Relative 9 %   Monocytes Absolute 0.9 0.1 - 1.0 K/uL   Eosinophils Relative 1 %   Eosinophils Absolute 0.1 0.0 - 0.5 K/uL   Basophils Relative 0 %   Basophils Absolute 0.0 0.0 - 0.1 K/uL   Immature Granulocytes 1 %   Abs Immature Granulocytes 0.10 (H) 0.00 - 0.07 K/uL  Troponin I (High Sensitivity)  Result Value Ref Range   Troponin I (High Sensitivity) 19.00 (H) <18 ng/L   Dg Chest Port 1 View Result Date: 07/27/2018 CLINICAL DATA:  Shortness of breath and decreased oxygen saturation EXAM: PORTABLE CHEST 1 VIEW COMPARISON:  None. FINDINGS: There is cardiomegaly with pulmonary venous hypertension. There is interstitial edema with small pleural effusions. There is no frank airspace consolidation. No adenopathy. There is aortic atherosclerosis. No bone lesions. IMPRESSION: Pulmonary vascular congestion with small pleural effusions and interstitial  edema. Suspect a degree of congestive heart failure. No consolidation. Aortic Atherosclerosis (ICD10-I70.0). Electronically Signed   By: Bretta BangWilliam  Woodruff III M.D.   On:  07/27/2018 08:47   Results for Carl Miller, Carl Miller (MRN [id number]) as of 07/27/2018 09:46  Ref. Range 02/11/2015 07:51 05/07/2015 07:49 08/12/2015 07:43 12/05/2016 09:54 07/27/2018 08:30  BUN Latest Ref Range: 8 - 23 mg/dL 27 (H) 27 (H) 35 (H) 25 (H) 55 (H)  Creatinine Latest Ref Range: 0.61 - 1.24 mg/dL 6.570.86 8.461.49 (H) 9.621.04 9.521.08 2.60 (H)     Carl Miller was evaluated in Emergency Department on 07/27/2018 for the symptoms described in the history of present illness. He was evaluated in the context of the global COVID-19 pandemic, which necessitated consideration that the patient might be at risk for infection with the SARS-CoV-2 virus that causes COVID-19. Institutional protocols and algorithms that pertain to the evaluation of patients at risk for COVID-19 are in a state of rapid change based on information released by regulatory bodies including the CDC and federal and state organizations. These policies and algorithms were followed during the patient's care in the ED.    1025:  BNP elevated with CHF on CXR; IV lasix given. O2 Sats slowly increasing to 92-99% while on O2 5L N/C. Dx and testing d/w pt.  Questions answered.  Verb understanding, agreeable to admit.  T/C returned from [facility]. Mariea ClontsEmokpae, case discussed, including:  HPI, pertinent PM/SHx, VS/PE, dx testing, ED course and treatment:  Agreeable to admit.      Final Clinical Impressions(s) / ED Diagnoses   Final diagnoses:  None    ED Discharge Orders    None       Samuel JesterMcManus, Beya Tipps, DO 07/31/18 1602

## 2018-07-27 NOTE — Progress Notes (Signed)
  Echocardiogram 2D Echocardiogram has been performed.  Marizol Borror G Sharmarke Cicio 07/27/2018, 3:11 PM

## 2018-07-27 NOTE — ED Triage Notes (Signed)
Presents with SOB, pitting bilateral lower extremity edema, orthopnea and abdominal swelling. Pt does not have a CHF history and does not require O2 at home. He is currently sating 89% on 5liters. He was 65% on RA on arrival.

## 2018-07-27 NOTE — H&P (Signed)
Patient Demographics:    Carl Miller, is a 73 y.o. 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 IV Lasix as ordered, daily weight and fluid input and output monitoring,  2)AKi--- last available creatinine is from 12/05/2016 it was 1.0, creatinine today is 2.6,... Unable to establish if patient has underlying CKD given lack of recent lab data.... Monitor renal function closely with IV diuresis.  Hold losartan and chlorthalidone, hold Aldactone  3) acute hypoxic respiratory failure--- secondary to CHF exacerbation as above in #1, treat as above #1, continue supplemental oxygen  4) obesity/OSA----continue CPAP nightly especially given acute hypoxic respiratory failure  5)HTN--- stable, continue Coreg 25 mg twice daily, amlodipine 5 mg daily,  may use IV Hydralazine 10 mg  Every 4 hours Prn for systolic blood pressure over 160 mmhg  6)DM2--A1c 7.6, reflecting fair diabetic control, stopped glipizide, continue Lantus insulin 30 units qhs, Use Novolog/Humalog Sliding scale insulin with Accu-Cheks/Fingersticks as ordered    With History of - Reviewed by me  Past Medical History:  Diagnosis Date   Diabetes mellitus ([place])    Diabetes mellitus  due to underlying condition with diabetic polyneuropathy ([place]) 01/28/2014   GERD (gastroesophageal reflux disease)    Headache    Hyperlipidemia    Hypertension    Palpitations    Stroke [facility])    Vision abnormalities    Worsening headaches 01/28/2014      Past Surgical History:  Procedure Laterality Date   BACK SURGERY     DENTAL SURGERY     LEFT HEART CATHETERIZATION WITH CORONARY ANGIOGRAM N/A 01/13/2014   Procedure: LEFT HEART CATHETERIZATION WITH CORONARY ANGIOGRAM;  Surgeon: Laverda Page, MD;  Location: [facility] CATH LAB;  Service: Cardiovascular;  Laterality: N/A;   lithotomy     SHOULDER SURGERY        Chief Complaint  Patient presents with   Shortness of Breath      HPI:    Carl Miller  is a 73 y.o. male presents to the ED with complaints of increasing lower extremity edema, about 20 pound weight gain, worsening shortness of breath,  orthopnea and abdominal swelling-over the last 2 to 3 weeks particularly worse over the last 2 days  -- Pt usually does not require O2 at home. He is currently sating 89% on 5liters. He was 65% on RA  on arrival  --- Denies chest pains palpitations or dizziness, denies leg pains or pleuritic symptoms  --- Denies noncompliance with medication... Admits to salt use  --in ED.Marland Kitchen. Patient is hypoxic, increased work of breathing, dyspnea, troponins are flat at 19, BNP 348, and chest x-ray is consistent with congestive heart failure, creatinine is 2.6 with previous baseline of 1.0 in November 2018 In ED--- patient received IV Lasix with some urine output    Review of systems:    In addition to the HPI above,   A full Review of  Systems was done, all other systems reviewed are negative except as noted above in HPI , .    Social History:  Reviewed by me    Social History   Tobacco Use   Smoking status: Former Smoker    Quit date: 01/18/1997    Years since quitting: 21.5   Smokeless tobacco: Former NeurosurgeonUser    Quit date:  01/17/1997  Substance Use Topics   Alcohol use: No    Alcohol/week: 0.0 standard drinks    Comment: quit 12/2012       Family History :  Reviewed by me    Family History  Problem Relation Age of Onset   Stroke Mother    Diabetes Mother    Heart attack Father      Home Medications:   Prior to Admission medications   Medication Sig Start Date End Date Taking? Authorizing Provider  albuterol (PROVENTIL HFA;VENTOLIN HFA) 108 (90 Base) MCG/ACT inhaler Inhale into the lungs. 10/13/16  Yes [provider]  alendronate (FOSAMAX) 70 MG tablet Take 70 mg by mouth every Saturday. Take with a full glass of water on an empty stomach.   Yes [provider]  amLODipine (NORVASC) 5 MG tablet Take 5 mg by mouth daily.   Yes [provider]  atorvastatin (LIPITOR) 40 MG tablet Take 40 mg by mouth daily.   Yes [provider]  carvedilol (COREG) 25 MG tablet Take 25 mg by mouth 2 (two) times daily with a meal.   Yes [provider]  chlorthalidone (HYGROTON) 25 MG tablet Take 25 mg by mouth daily.   Yes [provider]  clopidogrel (PLAVIX) 75 MG tablet Take 1 tablet (75 mg total) by mouth daily. 03/07/17  Yes Marvel PlanXu, [name], MD  gabapentin (NEURONTIN) 300 MG capsule Take 1 capsule (300 mg total) by mouth 2 (two) times daily. 08/14/17  Yes Vanschaick, Shanda BumpsJessica, NP  glipiZIDE (GLUCOTROL) 5 MG tablet Take 5 mg by mouth daily before breakfast.    Yes [provider]  Icosapent Ethyl 1 g CAPS Take 1 capsule by mouth daily.    Yes [provider]  Insulin Glargine (LANTUS SOLOSTAR) 100 UNIT/ML Solostar Pen Inject 30 Units into the skin at bedtime. 08/18/15  Yes Nida, Denman GeorgeGebreselassie W, MD  latanoprost (XALATAN) 0.005 % ophthalmic solution Place 1 drop into both eyes at bedtime.   Yes [provider]  loratadine (CLARITIN) 10 MG tablet Take 10 mg daily by mouth.   Yes [provider]  losartan (COZAAR) 100 MG tablet Take 100  mg by mouth daily.   Yes [provider]  Multiple Vitamin (MULTIVITAMIN WITH MINERALS) TABS tablet Take 1 tablet by mouth daily.   Yes [provider]  pantoprazole (PROTONIX) 40 MG tablet Take 40 mg daily by mouth.  01/02/14  Yes [provider]  triamcinolone ointment (KENALOG) 0.1 % APPLY OINTMENT TOPICALLY TO AFFECTED AREA TWICE DAILY 07/09/18  Yes [provider]  Vitamin D, Ergocalciferol, (DRISDOL) 50000 UNITS CAPS capsule Take 50,000 Units by mouth every Saturday.   Yes [provider]     Allergies:     Allergies  Allergen Reactions   Aspirin     Jittery, nausea when take "full dose".   Tape Other (See Comments)    Latex tape - pulls skin off.  NO latex allergy.     Physical Exam:   Vitals  Blood pressure (!) 143/78, pulse 75, temperature 97.8 F (36.6 C), temperature source Oral, resp. rate 18, height 6' (1.829 m), weight 111.1 kg, SpO2 90 %.  Physical Examination: General appearance - alert, dyspneic,  mental status - alert, oriented to person, place, and time,  Eyes - sclera anicteric Nose- Brenda 4 L/min Neck - supple, +ve JVD elevation , Chest -diminished in bases with bibasilar rales  heart - S1 and S2 normal, regular  Abdomen - soft, nontender, nondistended, no masses or organomegaly Neurological - screening mental status exam normal, neck supple without rigidity, cranial nerves II through XII intact, DTR's normal and symmetric Extremities -2 + pitting pedal edema noted, intact peripheral pulses  Skin - warm, dry    Data Review:    CBC Recent Labs  Lab 07/27/18 0830  WBC 10.0  HGB 10.5*  HCT 36.3*  PLT 195  MCV 98.4  MCH 28.5  MCHC 28.9*  RDW 14.7  LYMPHSABS 1.2  MONOABS 0.9  EOSABS 0.1  BASOSABS 0.0   ------------------------------------------------------------------------------------------------------------------  Chemistries  Recent Labs  Lab 07/27/18 0830  NA 142  K 4.5  CL 111  CO2 24    GLUCOSE 159*  BUN 55*  CREATININE 2.60*  CALCIUM 8.6*   ------------------------------------------------------------------------------------------------------------------ estimated creatinine clearance is 32.1 mL/min (A) (by C-G formula based on SCr of 2.6 mg/dL (H)). ------------------------------------------------------------------------------------------------------------------ No results for input(s): TSH, T4TOTAL, T3FREE, THYROIDAB in the last 72 hours.  Invalid input(s): FREET3   Coagulation profile No results for input(s): INR, PROTIME in the last 168 hours. ------------------------------------------------------------------------------------------------------------------- No results for input(s): DDIMER in the last 72 hours. -------------------------------------------------------------------------------------------------------------------  Cardiac Enzymes No results for input(s): CKMB, TROPONINI, MYOGLOBIN in the last 168 hours.  Invalid input(s): CK ------------------------------------------------------------------------------------------------------------------    Component Value Date/Time   BNP 348.0 (H) 07/27/2018 0830     ---------------------------------------------------------------------------------------------------------------  Urinalysis    Component Value Date/Time   COLORURINE YELLOW 12/05/2016 0947   APPEARANCEUR CLEAR 12/05/2016 0947   LABSPEC 1.012 12/05/2016 0947   PHURINE 7.0 12/05/2016 0947   GLUCOSEU 50 (A) 12/05/2016 0947   HGBUR NEGATIVE 12/05/2016 0947   BILIRUBINUR NEGATIVE 12/05/2016 0947   KETONESUR NEGATIVE 12/05/2016 0947   PROTEINUR >=300 (A) 12/05/2016 0947   NITRITE NEGATIVE 12/05/2016 0947   LEUKOCYTESUR NEGATIVE 12/05/2016 0947    ----------------------------------------------------------------------------------------------------------------   Imaging Results:    Dg Chest Port 1 View  Result Date: 07/27/2018 CLINICAL  DATA:  Shortness of breath and decreased oxygen saturation EXAM: PORTABLE CHEST 1 VIEW COMPARISON:  None. FINDINGS: There is cardiomegaly with pulmonary venous hypertension. There is interstitial edema with small pleural effusions. There is no frank airspace consolidation. No adenopathy. There is aortic atherosclerosis. No bone lesions. IMPRESSION: Pulmonary vascular congestion with small pleural effusions and interstitial edema. Suspect a degree of congestive heart failure. No consolidation. Aortic Atherosclerosis (ICD10-I70.0). Electronically Signed   By: Bretta BangWilliam  Woodruff III M.D.   On: 07/27/2018 08:47    Radiological Exams on Admission: Dg Chest Port 1 View  Result Date: 07/27/2018 CLINICAL DATA:  Shortness of breath  and decreased oxygen saturation EXAM: PORTABLE CHEST 1 VIEW COMPARISON:  None. FINDINGS: There is cardiomegaly with pulmonary venous hypertension. There is interstitial edema with small pleural effusions. There is no frank airspace consolidation. No adenopathy. There is aortic atherosclerosis. No bone lesions. IMPRESSION: Pulmonary vascular congestion with small pleural effusions and interstitial edema. Suspect a degree of congestive heart failure. No consolidation. Aortic Atherosclerosis (ICD10-I70.0). Electronically Signed   By: Bretta BangWilliam  Woodruff III M.D.   On: 07/27/2018 08:47    DVT Prophylaxis -SCD/heparin AM Labs Ordered, also please review Full Orders  Family Communication: Admission, patients condition and plan of care including tests being ordered have been discussed with the patient who indicate understanding and agree with the plan   Code Status - Full Code  Likely DC to  home  Condition   stable  Shon Haleourage Nyree Yonker M.D on 07/27/2018 at 6:29 PM Go to [email or web address] -  for contact info  [facility] - Office  [phone]

## 2018-07-28 ENCOUNTER — Inpatient Hospital Stay (HOSPITAL_COMMUNITY): Payer: Medicare Other

## 2018-07-28 LAB — BLOOD GAS, ARTERIAL
Acid-base deficit: 1 mmol/L (ref 0.0–2.0)
Acid-base deficit: 1.3 mmol/L (ref 0.0–2.0)
Bicarbonate: 22.4 mmol/L (ref 20.0–28.0)
Bicarbonate: 22.8 mmol/L (ref 20.0–28.0)
FIO2: 100
FIO2: 80
O2 Saturation: 76.5 %
O2 Saturation: 96.6 %
Patient temperature: 35
Patient temperature: 35.5
pCO2 arterial: 59.6 mmHg — ABNORMAL HIGH (ref 32.0–48.0)
pCO2 arterial: 53.3 mmHg — ABNORMAL HIGH (ref 32.0–48.0)
pH, Arterial: 7.251 — ABNORMAL LOW (ref 7.350–7.450)
pH, Arterial: 7.284 — ABNORMAL LOW (ref 7.350–7.450)
pO2, Arterial: 42.3 mmHg — ABNORMAL LOW (ref 83.0–108.0)
pO2, Arterial: 88.2 mmHg (ref 83.0–108.0)

## 2018-07-28 LAB — BASIC METABOLIC PANEL
Anion gap: 7 (ref 5–15)
Anion gap: 8 (ref 5–15)
BUN: 65 mg/dL — ABNORMAL HIGH (ref 8–23)
BUN: 63 mg/dL — ABNORMAL HIGH (ref 8–23)
CO2: 27 mmol/L (ref 22–32)
CO2: 25 mmol/L (ref 22–32)
Calcium: 8.5 mg/dL — ABNORMAL LOW (ref 8.9–10.3)
Calcium: 8.5 mg/dL — ABNORMAL LOW (ref 8.9–10.3)
Chloride: 110 mmol/L (ref 98–111)
Chloride: 110 mmol/L (ref 98–111)
Creatinine, Ser: 3.11 mg/dL — ABNORMAL HIGH (ref 0.61–1.24)
Creatinine, Ser: 3.31 mg/dL — ABNORMAL HIGH (ref 0.61–1.24)
GFR calc Af Amer: 22 mL/min — ABNORMAL LOW (ref >60)
GFR calc Af Amer: 20 mL/min — ABNORMAL LOW (ref >60)
GFR calc non Af Amer: 19 mL/min — ABNORMAL LOW (ref >60)
GFR calc non Af Amer: 17 mL/min — ABNORMAL LOW (ref >60)
Glucose, Bld: 224 mg/dL — ABNORMAL HIGH (ref 70–99)
Glucose, Bld: 238 mg/dL — ABNORMAL HIGH (ref 70–99)
Potassium: 5.2 mmol/L — ABNORMAL HIGH (ref 3.5–5.1)
Potassium: 5.5 mmol/L — ABNORMAL HIGH (ref 3.5–5.1)
Sodium: 144 mmol/L (ref 135–145)
Sodium: 143 mmol/L (ref 135–145)

## 2018-07-28 LAB — GLUCOSE, CAPILLARY
Glucose-Capillary: 188 mg/dL — ABNORMAL HIGH (ref 70–99)
Glucose-Capillary: 201 mg/dL — ABNORMAL HIGH (ref 70–99)
Glucose-Capillary: 177 mg/dL — ABNORMAL HIGH (ref 70–99)
Glucose-Capillary: 170 mg/dL — ABNORMAL HIGH (ref 70–99)
Glucose-Capillary: 236 mg/dL — ABNORMAL HIGH (ref 70–99)

## 2018-07-28 LAB — MAGNESIUM: Magnesium: 2.5 mg/dL — ABNORMAL HIGH (ref 1.7–2.4)

## 2018-07-28 LAB — HEPATIC FUNCTION PANEL
ALT: 20 U/L (ref 0–44)
AST: 15 U/L (ref 15–41)
Albumin: 3.2 g/dL — ABNORMAL LOW (ref 3.5–5.0)
Alkaline Phosphatase: 77 U/L (ref 38–126)
Bilirubin, Direct: 0.1 mg/dL (ref 0.0–0.2)
Indirect Bilirubin: 0.2 mg/dL — ABNORMAL LOW (ref 0.3–0.9)
Total Bilirubin: 0.3 mg/dL (ref 0.3–1.2)
Total Protein: 6.6 g/dL (ref 6.5–8.1)

## 2018-07-28 LAB — CBC
HCT: 37.9 % — ABNORMAL LOW (ref 39.0–52.0)
Hemoglobin: 10.5 g/dL — ABNORMAL LOW (ref 13.0–17.0)
MCH: 28.5 pg (ref 26.0–34.0)
MCHC: 27.7 g/dL — ABNORMAL LOW (ref 30.0–36.0)
MCV: 103 fL — ABNORMAL HIGH (ref 80.0–100.0)
Platelets: 195 10*3/uL (ref 150–400)
RBC: 3.68 MIL/uL — ABNORMAL LOW (ref 4.22–5.81)
RDW: 14.7 % (ref 11.5–15.5)
WBC: 12.6 10*3/uL — ABNORMAL HIGH (ref 4.0–10.5)
nRBC: 0.3 % — ABNORMAL HIGH (ref 0.0–0.2)

## 2018-07-28 LAB — TROPONIN I (HIGH SENSITIVITY)
Troponin I (High Sensitivity): 16 ng/L (ref <18)
Troponin I (High Sensitivity): 15 ng/L (ref <18)

## 2018-07-28 LAB — LACTIC ACID, PLASMA
Lactic Acid, Venous: 0.4 mmol/L — ABNORMAL LOW (ref 0.5–1.9)
Lactic Acid, Venous: 0.9 mmol/L (ref 0.5–1.9)

## 2018-07-28 LAB — MRSA PCR SCREENING: MRSA by PCR: NEGATIVE

## 2018-07-28 MED ORDER — CLOPIDOGREL BISULFATE 75 MG PO TABS
75.0000 mg | ORAL_TABLET | Freq: Every day | ORAL | Status: DC
  Filled 2018-07-28: qty 1

## 2018-07-28 MED ORDER — ADULT MULTIVITAMIN LIQUID CH
15.0000 mL | Freq: Every day | ORAL | Status: DC
  Administered 2018-07-28 – 2018-07-30 (×3): 15 mL
  Filled 2018-07-28 (×3): qty 15

## 2018-07-28 MED ORDER — GABAPENTIN 300 MG PO CAPS: 300.0000 mg | ORAL_CAPSULE | Freq: Two times a day (BID) | ORAL | Status: DC

## 2018-07-28 MED ORDER — TRAZODONE HCL 50 MG PO TABS: 50.0000 mg | ORAL_TABLET | Freq: Every evening | ORAL | Status: DC | PRN

## 2018-07-28 MED ORDER — PROPOFOL 1000 MG/100ML IV EMUL
0.0000 ug/kg/min | INTRAVENOUS | Status: DC
  Administered 2018-07-28: 08:00:00 5 ug/kg/min via INTRAVENOUS
  Administered 2018-07-28: 20 ug/kg/min via INTRAVENOUS
  Administered 2018-07-28: 15 ug/kg/min via INTRAVENOUS
  Administered 2018-07-29: 25 ug/kg/min via INTRAVENOUS
  Administered 2018-07-29 (×2): 35 ug/kg/min via INTRAVENOUS
  Administered 2018-07-29: 25 ug/kg/min via INTRAVENOUS
  Administered 2018-07-29: 30 ug/kg/min via INTRAVENOUS
  Administered 2018-07-30: 25 ug/kg/min via INTRAVENOUS
  Administered 2018-07-30: 50 ug/kg/min via INTRAVENOUS
  Administered 2018-07-30: 35 ug/kg/min via INTRAVENOUS
  Filled 2018-07-28 (×8): qty 100
  Filled 2018-07-28: qty 200
  Filled 2018-07-28: qty 100

## 2018-07-28 MED ORDER — CLOPIDOGREL BISULFATE 75 MG PO TABS
75.0000 mg | ORAL_TABLET | Freq: Every day | ORAL | Status: DC
  Administered 2018-07-28 – 2018-07-30 (×3): 75 mg
  Filled 2018-07-28 (×2): qty 1

## 2018-07-28 MED ORDER — INSULIN GLARGINE 100 UNIT/ML ~~LOC~~ SOLN
10.0000 [IU] | Freq: Every day | SUBCUTANEOUS | Status: DC
  Administered 2018-07-28 – 2018-07-29 (×2): 10 [IU] via SUBCUTANEOUS
  Filled 2018-07-28 (×3): qty 0.1

## 2018-07-28 MED ORDER — MIDAZOLAM HCL 2 MG/2ML IJ SOLN
INTRAMUSCULAR | Status: AC
  Administered 2018-07-28: 08:00:00 2 mg via INTRAVENOUS
  Filled 2018-07-28: qty 2

## 2018-07-28 MED ORDER — IPRATROPIUM-ALBUTEROL 0.5-2.5 (3) MG/3ML IN SOLN
3.0000 mL | Freq: Four times a day (QID) | RESPIRATORY_TRACT | Status: DC
  Administered 2018-07-28 – 2018-07-31 (×15): 3 mL via RESPIRATORY_TRACT
  Filled 2018-07-28 (×15): qty 3

## 2018-07-28 MED ORDER — MIDAZOLAM HCL 2 MG/2ML IJ SOLN
2.0000 mg | INTRAMUSCULAR | Status: DC | PRN
  Administered 2018-07-28 – 2018-07-30 (×11): 2 mg via INTRAVENOUS
  Filled 2018-07-28 (×11): qty 2

## 2018-07-28 MED ORDER — ORAL CARE MOUTH RINSE
15.0000 mL | OROMUCOSAL | Status: DC
  Administered 2018-07-28 – 2018-07-30 (×22): 15 mL via OROMUCOSAL

## 2018-07-28 MED ORDER — ACETAMINOPHEN 650 MG RE SUPP: 650.0000 mg | Freq: Four times a day (QID) | RECTAL | Status: DC | PRN

## 2018-07-28 MED ORDER — CHLORHEXIDINE GLUCONATE CLOTH 2 % EX PADS
6.0000 | MEDICATED_PAD | Freq: Every day | CUTANEOUS | Status: DC
  Administered 2018-07-28 – 2018-07-31 (×4): 6 via TOPICAL

## 2018-07-28 MED ORDER — ATORVASTATIN CALCIUM 40 MG PO TABS
40.0000 mg | ORAL_TABLET | Freq: Every day | ORAL | Status: DC
  Administered 2018-07-28 – 2018-07-30 (×3): 40 mg
  Filled 2018-07-28 (×2): qty 1

## 2018-07-28 MED ORDER — INSULIN GLARGINE 100 UNIT/ML ~~LOC~~ SOLN
15.0000 [IU] | Freq: Every day | SUBCUTANEOUS | Status: DC
  Filled 2018-07-28 (×2): qty 0.15

## 2018-07-28 MED ORDER — ONDANSETRON HCL 4 MG/2ML IJ SOLN: 4.0000 mg | Freq: Four times a day (QID) | INTRAMUSCULAR | Status: DC | PRN

## 2018-07-28 MED ORDER — PANTOPRAZOLE SODIUM 40 MG PO PACK
40.0000 mg | PACK | Freq: Every day | ORAL | Status: DC
  Administered 2018-07-28 – 2018-07-30 (×3): 40 mg
  Filled 2018-07-28 (×3): qty 20

## 2018-07-28 MED ORDER — AMLODIPINE BESYLATE 5 MG PO TABS
5.0000 mg | ORAL_TABLET | Freq: Every day | ORAL | Status: DC
  Administered 2018-07-29 – 2018-07-30 (×2): 5 mg
  Filled 2018-07-28 (×2): qty 1

## 2018-07-28 MED ORDER — GABAPENTIN 250 MG/5ML PO SOLN
300.0000 mg | Freq: Two times a day (BID) | ORAL | Status: DC
  Administered 2018-07-28 – 2018-07-30 (×4): 300 mg
  Filled 2018-07-28 (×8): qty 6

## 2018-07-28 MED ORDER — ORAL CARE MOUTH RINSE
15.0000 mL | OROMUCOSAL | Status: DC
  Administered 2018-07-28: 15 mL via OROMUCOSAL

## 2018-07-28 MED ORDER — SENNA 8.6 MG PO TABS
1.0000 | ORAL_TABLET | Freq: Every day | ORAL | Status: DC
  Administered 2018-07-28 – 2018-07-29 (×2): 8.6 mg
  Filled 2018-07-28 (×2): qty 1

## 2018-07-28 MED ORDER — CHLORHEXIDINE GLUCONATE 0.12% ORAL RINSE (MEDLINE KIT)
15.0000 mL | Freq: Two times a day (BID) | OROMUCOSAL | Status: DC
  Administered 2018-07-28: 15 mL via OROMUCOSAL

## 2018-07-28 MED ORDER — ADULT MULTIVITAMIN W/MINERALS CH: 1.0000 | ORAL_TABLET | Freq: Every day | ORAL | Status: DC

## 2018-07-28 MED ORDER — POLYETHYLENE GLYCOL 3350 17 G PO PACK: 17.0000 g | PACK | Freq: Every day | ORAL | Status: DC | PRN

## 2018-07-28 MED ORDER — ENSURE ENLIVE PO LIQD
237.0000 mL | Freq: Four times a day (QID) | ORAL | Status: DC
  Administered 2018-07-28 (×4): 237 mL

## 2018-07-28 MED ORDER — ACETAMINOPHEN 325 MG PO TABS
650.0000 mg | ORAL_TABLET | Freq: Four times a day (QID) | ORAL | Status: DC | PRN
  Administered 2018-07-28: 650 mg
  Filled 2018-07-28: qty 2

## 2018-07-28 MED ORDER — CHLORHEXIDINE GLUCONATE 0.12% ORAL RINSE (MEDLINE KIT)
15.0000 mL | Freq: Two times a day (BID) | OROMUCOSAL | Status: DC
  Administered 2018-07-28 – 2018-07-30 (×4): 15 mL via OROMUCOSAL

## 2018-07-28 MED ORDER — NALOXONE HCL 0.4 MG/ML IJ SOLN
INTRAMUSCULAR | Status: AC
  Filled 2018-07-28: qty 1

## 2018-07-28 MED ORDER — CARVEDILOL 12.5 MG PO TABS: 25.0000 mg | ORAL_TABLET | Freq: Two times a day (BID) | ORAL | Status: DC

## 2018-07-28 MED ORDER — INSULIN ASPART 100 UNIT/ML ~~LOC~~ SOLN
0.0000 [IU] | SUBCUTANEOUS | Status: DC
  Administered 2018-07-28: 2 [IU] via SUBCUTANEOUS
  Administered 2018-07-28: 3 [IU] via SUBCUTANEOUS
  Administered 2018-07-29 (×2): 5 [IU] via SUBCUTANEOUS
  Administered 2018-07-29 (×2): 2 [IU] via SUBCUTANEOUS
  Administered 2018-07-29: 3 [IU] via SUBCUTANEOUS
  Administered 2018-07-29 (×2): 5 [IU] via SUBCUTANEOUS
  Administered 2018-07-30: 7 [IU] via SUBCUTANEOUS
  Administered 2018-07-30 (×2): 9 [IU] via SUBCUTANEOUS

## 2018-07-28 MED ORDER — PIPERACILLIN-TAZOBACTAM 3.375 G IVPB
3.3750 g | Freq: Three times a day (TID) | INTRAVENOUS | Status: DC
  Administered 2018-07-28 – 2018-07-30 (×6): 3.375 g via INTRAVENOUS
  Filled 2018-07-28 (×6): qty 50

## 2018-07-28 MED ORDER — ONDANSETRON HCL 4 MG PO TABS: 4.0000 mg | ORAL_TABLET | Freq: Four times a day (QID) | ORAL | Status: DC | PRN

## 2018-07-28 MED ORDER — VITAMIN D (ERGOCALCIFEROL) 1.25 MG (50000 UNIT) PO CAPS: 50000.0000 [IU] | ORAL_CAPSULE | ORAL | Status: DC

## 2018-07-28 MED ORDER — ATORVASTATIN CALCIUM 40 MG PO TABS
40.0000 mg | ORAL_TABLET | Freq: Every day | ORAL | Status: DC
  Filled 2018-07-28: qty 1

## 2018-07-28 MED ORDER — CARVEDILOL 12.5 MG PO TABS
25.0000 mg | ORAL_TABLET | Freq: Two times a day (BID) | ORAL | Status: DC
  Administered 2018-07-30: 25 mg
  Filled 2018-07-28: qty 2

## 2018-07-28 NOTE — Progress Notes (Signed)
Dr Joesph Fillers aware of patient temp being 94.3 via temp foley and patient being brady in the 40's and 50's this AM. Warming blanket applied and removed once temp was 36.5. Patient's temp noted to increase throughout the afternoon up to 38.4. PRN tylenol given and ice packs applied. Dr. Joesph Fillers made aware of temp and interventions. Family was in to visit this morning and emotional support provided.

## 2018-07-28 NOTE — Progress Notes (Addendum)
Patient Demographics:    Carl Miller, is a 73 y.o. 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  A/p 1) acute hypoxic and hypercapnic respiratory failure due to combination of CHF exacerbation and OSA with noncompliance with CPAP--- currently intubated and ventilated, pulmonary consult from Dr. Luan Miller appreciated, troponin and lactic acid not elevated  2) possible aspiration event--- patient had episode of hypothermia then low-grade fevers, WBC is up to 12.6 from 10.0, lactic acid is not elevated, pulmonologist Dr. Luan Miller started patient on IV Zosyn empirically, blood cultures requested, continue respiratory support on the vent, continue bronchodilators  3)HFpEF--- acute on chronic preserved EF CHF exacerbation with  hypoxia, echo with EF of 60 to 65% on 07/27/2018, troponins are flat,...  Continue diuresis await IV Lasix as ordered, daily weight and fluid input and output monitoring,  4)AKi--- last available creatinine is from 12/05/2016 it was 1.0, creatinine on admission was 2.6,...  Creatinine is now up to 3.31, continue to monitor closely ... unable to establish if patient has underlying CKD given lack of recent lab data.... -  Hold losartan and chlorthalidone, hold Aldactone,   5)obesity/OSA----PTA and during hospitalization patient was not compliant with CPAP , now intubated and ventilated  6)HTN---  hold Coreg due to significant bradycardia, c/n amlodipine 5 mg daily,  may use IV Hydralazine 10 mg  Every 4 hours Prn for systolic blood pressure over 160 mmhg  6)DM2--A1c 7.6, reflecting fair diabetic control, stopped glipizide,  decrease Lantus insulin from 30 units qhs, to 10 units... As patient is currently intubated and n.p.o., use Novolog/Humalog Sliding scale insulin with Accu-Cheks/Fingersticks as ordered   7)Social/Ethics--plan of care discussed with patient's son, daughter and grandson at bedside, he remains a full code with no limitations to treatment at this time  CRITICAL CARE Performed by: Carl Miller   Total critical care time: 67 minutes  Critical care time was exclusive of separately billable procedures and treating other patients.  Pt has Acute hypoxic and hypercapnic respiratory failure requiring intubation and sedation, patient had significant bradycardia and hypothermia requiring interventions overall condition is critical, prognosis is guarded  Critical care was necessary to treat or prevent imminent or life-threatening deterioration.  Critical care was time spent personally by me on the following activities: development of treatment plan with patient and/or surrogate as well as nursing, discussions with consultants, evaluation of patient's response to treatment,  examination of patient, obtaining history from patient or surrogate, ordering and performing treatments and interventions, ordering and review of laboratory studies, ordering and review of radiographic studies, pulse oximetry and re-evaluation of patient's condition.   Code Status : Full code  Family Communication:   Son, daughter-in-law and grandson at bedside  Disposition Plan  : TBD  Consults  :  Pulm  DVT Prophylaxis  :   - Heparin - SCDs   Lab Results  Component Value Date   PLT 195 07/28/2018    Inpatient Medications  Scheduled Meds:  amLODipine  5 mg Oral Daily   atorvastatin  40 mg Oral Daily   [START ON 07/30/2018] carvedilol  25 mg Oral BID WC   chlorhexidine gluconate (MEDLINE KIT)  15 mL Mouth Rinse BID   Chlorhexidine Gluconate Cloth  6 each Topical Q0600   clopidogrel  75 mg Oral Daily   feeding supplement (ENSURE ENLIVE)  237 mL Per Tube QID   furosemide  60 mg Intravenous Q12H   gabapentin  300 mg Oral BID   heparin  5,000 Units Subcutaneous Q8H   insulin aspart  0-15 Units Subcutaneous TID WC   insulin aspart  0-5 Units Subcutaneous QHS   insulin glargine  15 Units Subcutaneous QHS   ipratropium-albuterol  3 mL Inhalation Q6H   latanoprost  1 drop Both Eyes QHS   mouth rinse  15 mL Mouth Rinse 10 times per day   metolazone  5 mg Oral Once   multivitamin with minerals  1 tablet Oral Daily   naloxone       pantoprazole  40 mg Oral Daily   senna  1 tablet Per Tube QHS   sodium chloride flush  3 mL Intravenous Q12H   Vitamin D (Ergocalciferol)  50,000 Units Oral Q Sat   Continuous Infusions:  sodium chloride     propofol (DIPRIVAN) infusion 15 mcg/kg/min (07/28/18 0756)   PRN Meds:.sodium chloride, acetaminophen **OR** acetaminophen, albuterol, hydrALAZINE, meclizine, midazolam, ondansetron **OR** ondansetron (ZOFRAN) IV, polyethylene glycol, sodium chloride flush, traZODone    Anti-infectives (From admission, onward)   None          Objective:   Vitals:   07/27/18 1925 07/27/18 2108 07/28/18 0603 07/28/18 0727  BP:  (!) 131/108 (!) 143/68   Pulse:  69 71   Resp:  18    Temp:  98.3 F (36.8 C) 97.7 F (36.5 C)   TempSrc:  Oral Oral   SpO2: 95% 90% (!) 56% 100%  Weight:   110 kg   Height:        Wt Readings from Last 3 Encounters:  07/28/18 110 kg  08/14/17 104.3 kg  05/15/17 103.7 kg     Intake/Output Summary (Last 24 hours) at 07/28/2018 0904 Last data filed at 07/28/2018 0756 Gross per 24 hour  Intake 480.46 ml  Output 450 ml  Net 30.46 ml     Physical Exam  Gen:-Intubated and sedated  HEENT:-ET tube and OG tube  Lungs-diminished in bases left more than right with scattered rhonchi CV- S1, S2 normal, regular , was bradycardic and then today Abd-  +ve B.Sounds, Abd Soft,   Extremity/Skin:- 2+  edema, pedal pulses present  Psych/Neuro---intubated and sedated   Data Review:   Micro Results Recent Results (from the  past 240 hour(s))  SARS Coronavirus 2 (CEPHEID - Performed in [place] hospital lab), Hosp Order     Status: None   Collection Time: 07/27/18  8:29 AM   Specimen: Nasopharyngeal Swab  Result Value Ref Range Status   SARS Coronavirus 2 NEGATIVE NEGATIVE Final    Comment: (NOTE) If result is NEGATIVE SARS-CoV-2 target nucleic acids are NOT DETECTED. The SARS-CoV-2 RNA is generally detectable in upper and lower  respiratory specimens during the acute phase of infection. The lowest  concentration of SARS-CoV-2 viral copies this assay can detect is 250  copies / mL. A negative result does not preclude SARS-CoV-2 infection  and should not be used as the sole basis for treatment or other  patient management decisions.  A negative result may occur with  improper specimen collection / handling, submission of specimen other  than nasopharyngeal swab, presence of viral mutation(s) within the  areas targeted by this assay, and inadequate number of viral copies  (<250 copies /  mL). A negative result must be combined with clinical  observations, patient history, and epidemiological information. If result is POSITIVE SARS-CoV-2 target nucleic acids are DETECTED. The SARS-CoV-2 RNA is generally detectable in upper and lower  respiratory specimens dur ing the acute phase of infection.  Positive  results are indicative of active infection with SARS-CoV-2.  Clinical  correlation with patient history and other diagnostic information is  necessary to determine patient infection status.  Positive results do  not rule out bacterial infection or co-infection with other viruses. If result is PRESUMPTIVE POSTIVE SARS-CoV-2 nucleic acids MAY BE PRESENT.   A presumptive positive result was obtained on the submitted specimen  and confirmed on repeat testing.  While 2019 novel coronavirus  (SARS-CoV-2) nucleic acids may be present in the submitted sample  additional confirmatory testing may be necessary for epidemiological  and / or clinical management purposes  to differentiate between  SARS-CoV-2 and other Sarbecovirus currently known to infect humans.  If clinically indicated additional testing with an alternate test  methodology [phone]) is advised. The SARS-CoV-2 RNA is generally  detectable in upper and lower respiratory sp ecimens during the acute  phase of infection. The expected result is Negative. Fact Sheet for Patients:  StrictlyIdeas.no Fact Sheet for Healthcare Providers: BankingDealers.co.za This test is not yet approved or cleared by the [place] FDA and has been authorized for detection and/or diagnosis of SARS-CoV-2 by FDA under an Emergency Use Authorization (EUA).  This EUA will remain in effect (meaning this test can be used) for the duration of the COVID-19 declaration under Section 564(b)(1) of the Act, 21 U.S.C. section 360bbb-3(b)(1), unless the authorization is terminated or revoked  sooner. Performed at [facility], [address], [place], [place] [address]     Radiology Reports Dg Chest [place] 1 View  Result Date: 07/28/2018 CLINICAL DATA:  Dyspnea. Respiratory abnormalities. Placement of endotracheal tube and nasogastric tube. EXAM: PORTABLE CHEST 1 VIEW COMPARISON:  07/27/2018 FINDINGS: Endotracheal tube is 2.6 cm above the carina. Nasogastric tube extends into the abdomen but the tip is beyond the image. Cardiac pad overlying the left chest. Increased densities at the left lung base suggestive for consolidation and/or pleural fluid. Increased hazy densities at the right lung base. Negative for pneumothorax. Heart size is upper limits of normal and stable. IMPRESSION: 1. Endotracheal tube is appropriately positioned. Nasogastric tube extends into the abdomen. 2. Increased basilar chest densities bilaterally. Findings could represent a combination of consolidation and pleural fluid. Electronically  Signed   By: Markus Daft M.D.   On: 07/28/2018 08:44   Dg Chest Port 1 View  Result Date: 07/27/2018 CLINICAL DATA:  Shortness of breath and decreased oxygen saturation EXAM: PORTABLE CHEST 1 VIEW COMPARISON:  None. FINDINGS: There is cardiomegaly with pulmonary venous hypertension. There is interstitial edema with small pleural effusions. There is no frank airspace consolidation. No adenopathy. There is aortic atherosclerosis. No bone lesions. IMPRESSION: Pulmonary vascular congestion with small pleural effusions and interstitial edema. Suspect a degree of congestive heart failure. No consolidation. Aortic Atherosclerosis (ICD10-I70.0). Electronically Signed   By: Lowella Grip III M.D.   On: 07/27/2018 08:47     CBC Recent Labs  Lab 07/27/18 0830 07/28/18 0545  WBC 10.0 12.6*  HGB 10.5* 10.5*  HCT 36.3* 37.9*  PLT 195 195  MCV 98.4 103.0*  MCH 28.5 28.5  MCHC 28.9* 27.7*  RDW 14.7 14.7  LYMPHSABS 1.2  --   MONOABS 0.9  --   EOSABS 0.1  --   BASOSABS 0.0  --      Chemistries  Recent Labs  Lab 07/27/18 0830 07/28/18 0545 07/28/18 0642  NA 142 144 143  K 4.5 5.2* 5.5*  CL 111 110 110  CO2 _0 GLUCOSE 159* 224* 238*  BUN 55* 65* 63*  CREATININE 2.60* 3.11* 3.31*  CALCIUM 8.6* 8.5* 8.5*  MG  --   --  2.5*  AST  --   --  15  ALT  --   --  20  ALKPHOS  --   --  77  BILITOT  --   --  0.3   ------------------------------------------------------------------------------------------------------------------ No results for input(s): CHOL, HDL, LDLCALC, TRIG, CHOLHDL, LDLDIRECT in the last 72 hours.  Lab Results  Component Value Date   HGBA1C 7.6 (H) 07/27/2018   ------------------------------------------------------------------------------------------------------------------ No results for input(s): TSH, T4TOTAL, T3FREE, THYROIDAB in the last 72 hours.  Invalid input(s): FREET3 ------------------------------------------------------------------------------------------------------------------ No results for input(s): VITAMINB12, FOLATE, FERRITIN, TIBC, IRON, RETICCTPCT in the last 72 hours.  Coagulation profile No results for input(s): INR, PROTIME in the last 168 hours.  No results for input(s): DDIMER in the last 72 hours.  Cardiac Enzymes No results for input(s): CKMB, TROPONINI, MYOGLOBIN in the last 168 hours.  Invalid input(s): CK ------------------------------------------------------------------------------------------------------------------    Component Value Date/Time   BNP 348.0 (H) 07/27/2018 0830     Carl Miller M.D on 07/28/2018 at 9:04 AM  Go to [email or web address] - for contact info  [facility] - Office  [phone]

## 2018-07-28 NOTE — Progress Notes (Signed)
Patient's daughter Rollene Fare) took patient's watch, cell phone, Games developer, hearing aides and dentures home with her.

## 2018-07-28 NOTE — Consult Note (Addendum)
Consult requested by: [facility] Consult requested for: Respiratory failure  HPI: This is a 73 year old who came to the hospital with increasing shortness of breath.  He was found to have acute on chronic diastolic heart failure.  He was started on Lasix.  He also had acute kidney injury with elevation of creatinine.  He had acute hypoxic respiratory failure and he was started on supplemental oxygen.  He is known to have sleep apnea but has refused CPAP.  He was admitted and started on treatment when he developed increasing shortness of breath was found to be unresponsive was intubated and placed on mechanical ventilation early this morning.  He remains on the ventilator.  History is from the medical record is he is intubated and there is no family available.  He was hypothermic when he came down to the intensive care unit and he is on a warming system  Past Medical History:  Diagnosis Date  . Diabetes mellitus ([place])   . Diabetes mellitus due to underlying condition with diabetic polyneuropathy ([place]) 01/28/2014  . GERD (gastroesophageal reflux disease)   . Headache   . Hyperlipidemia   . Hypertension   . Palpitations   . Stroke ([place])   . Vision abnormalities   . Worsening headaches 01/28/2014     Family History  Problem Relation Age of Onset  . Stroke Mother   . Diabetes Mother   . Heart attack Father      Social History   Socioeconomic History  . Marital status: Single    Spouse name: Not on file  . Number of children: Not on file  . Years of education: Not on file  . Highest education level: Not on file  Occupational History  . Not on file  Social Needs  . Financial resource strain: Not on file  . Food insecurity    Worry: Not on file    Inability: Not on file  . Transportation needs    Medical: Not on file    Non-medical: Not on file  Tobacco Use  . Smoking status: Former Smoker    Quit date: 01/18/1997    Years since quitting: 21.5  . Smokeless tobacco: Former  Systems developer    Quit date: 01/17/1997  Substance and Sexual Activity  . Alcohol use: No    Alcohol/week: 0.0 standard drinks    Comment: quit 12/2012  . Drug use: No  . Sexual activity: Not on file  Lifestyle  . Physical activity    Days per week: Not on file    Minutes per session: Not on file  . Stress: Not on file  Relationships  . Social Herbalist on phone: Not on file    Gets together: Not on file    Attends religious service: Not on file    Active member of club or organization: Not on file    Attends meetings of clubs or organizations: Not on file    Relationship status: Not on file  Other Topics Concern  . Not on file  Social History Narrative   Caffeine 30 ounces/ daily  (2 diet Mtn Dew/ week. Retired, lives alone.  Divorced.      ROS: Unobtainable    Objective: Vital signs in last 24 hours: Temp:  [94.3 F (34.6 C)-98.3 F (36.8 C)] 95.5 F (35.3 C) (07/11 1000) Pulse Rate:  [49-75] 54 (07/11 1000) Resp:  [11-18] 17 (07/11 1000) BP: (110-143)/(62-108) 110/62 (07/11 1000) SpO2:  [56 %-100 %] 97 % (07/11  1000) FiO2 (%):  [80 %-100 %] 80 % (07/11 0900) Weight:  [110 kg] 110 kg (07/11 0603) Weight change:  Last BM Date: 07/26/18  Intake/Output from previous day: 07/10 0701 - 07/11 0700 In: 480 [P.O.:480] Out: 450 [Urine:450]  PHYSICAL EXAM Constitutional: He is intubated and sedated and on mechanical ventilation eyes: Pupils react.  Ears nose mouth and throat he has ETT and orogastric tube in place.  Cardiovascular: His heart is regular with normal heart sounds.  I do not hear a gallop.  Respiratory: Respiratory effort per the ventilator.  He has significant bilateral rhonchi gastrointestinal: His abdomen is soft with no masses.  Musculoskeletal: Cannot assess.  Neurological: Cannot assess.  Psychiatric: Cannot assess  Lab Results: Basic Metabolic Panel: Recent Labs    07/28/18 0545 07/28/18 0642  NA 144 143  K 5.2* 5.5*  CL 110 110  CO2 27 25   GLUCOSE 224* 238*  BUN 65* 63*  CREATININE 3.11* 3.31*  CALCIUM 8.5* 8.5*  MG  --  2.5*   Liver Function Tests: Recent Labs    07/28/18 0642  AST 15  ALT 20  ALKPHOS 77  BILITOT 0.3  PROT 6.6  ALBUMIN 3.2*   No results for input(s): LIPASE, AMYLASE in the last 72 hours. No results for input(s): AMMONIA in the last 72 hours. CBC: Recent Labs    07/27/18 0830 07/28/18 0545  WBC 10.0 12.6*  NEUTROABS 7.7  --   HGB 10.5* 10.5*  HCT 36.3* 37.9*  MCV 98.4 103.0*  PLT 195 195   Cardiac Enzymes: No results for input(s): CKTOTAL, CKMB, CKMBINDEX, TROPONINI in the last 72 hours. BNP: No results for input(s): PROBNP in the last 72 hours. D-Dimer: No results for input(s): DDIMER in the last 72 hours. CBG: Recent Labs    07/27/18 1200 07/27/18 1616 07/27/18 2105 07/28/18 0617 07/28/18 0800 07/28/18 1117  GLUCAP 124* 207* 163* 188* 201* 177*   Hemoglobin A1C: Recent Labs    07/27/18 1050  HGBA1C 7.6*   Fasting Lipid Panel: No results for input(s): CHOL, HDL, LDLCALC, TRIG, CHOLHDL, LDLDIRECT in the last 72 hours. Thyroid Function Tests: No results for input(s): TSH, T4TOTAL, FREET4, T3FREE, THYROIDAB in the last 72 hours. Anemia Panel: No results for input(s): VITAMINB12, FOLATE, FERRITIN, TIBC, IRON, RETICCTPCT in the last 72 hours. Coagulation: No results for input(s): LABPROT, INR in the last 72 hours. Urine Drug Screen: Drugs of Abuse  No results found for: LABOPIA, COCAINSCRNUR, LABBENZ, AMPHETMU, THCU, LABBARB  Alcohol Level: No results for input(s): ETH in the last 72 hours. Urinalysis: Recent Labs    07/27/18 1929  COLORURINE YELLOW  LABSPEC 1.012  PHURINE 5.0  GLUCOSEU 150*  HGBUR NEGATIVE  BILIRUBINUR NEGATIVE  KETONESUR NEGATIVE  PROTEINUR >=300*  NITRITE NEGATIVE  LEUKOCYTESUR NEGATIVE   Misc. Labs:   ABGS: Recent Labs    07/28/18 1028  PHART 7.284*  PO2ART 88.2  HCO3 22.8     MICROBIOLOGY: Recent Results (from the past  240 hour(s))  SARS Coronavirus 2 (CEPHEID - Performed in [place] hospital lab), Hosp Order     Status: None   Collection Time: 07/27/18  8:29 AM   Specimen: Nasopharyngeal Swab  Result Value Ref Range Status   SARS Coronavirus 2 NEGATIVE NEGATIVE Final    Comment: (NOTE) If result is NEGATIVE SARS-CoV-2 target nucleic acids are NOT DETECTED. The SARS-CoV-2 RNA is generally detectable in upper and lower  respiratory specimens during the acute phase of infection. The lowest  concentration  of SARS-CoV-2 viral copies this assay can detect is 250  copies / mL. A negative result does not preclude SARS-CoV-2 infection  and should not be used as the sole basis for treatment or other  patient management decisions.  A negative result may occur with  improper specimen collection / handling, submission of specimen other  than nasopharyngeal swab, presence of viral mutation(s) within the  areas targeted by this assay, and inadequate number of viral copies  (<250 copies / mL). A negative result must be combined with clinical  observations, patient history, and epidemiological information. If result is POSITIVE SARS-CoV-2 target nucleic acids are DETECTED. The SARS-CoV-2 RNA is generally detectable in upper and lower  respiratory specimens dur ing the acute phase of infection.  Positive  results are indicative of active infection with SARS-CoV-2.  Clinical  correlation with patient history and other diagnostic information is  necessary to determine patient infection status.  Positive results do  not rule out bacterial infection or co-infection with other viruses. If result is PRESUMPTIVE POSTIVE SARS-CoV-2 nucleic acids MAY BE PRESENT.   A presumptive positive result was obtained on the submitted specimen  and confirmed on repeat testing.  While 2019 novel coronavirus  (SARS-CoV-2) nucleic acids may be present in the submitted sample  additional confirmatory testing may be necessary for  epidemiological  and / or clinical management purposes  to differentiate between  SARS-CoV-2 and other Sarbecovirus currently known to infect humans.  If clinically indicated additional testing with an alternate test  methodology [phone]) is advised. The SARS-CoV-2 RNA is generally  detectable in upper and lower respiratory sp ecimens during the acute  phase of infection. The expected result is Negative. Fact Sheet for Patients:  StrictlyIdeas.no Fact Sheet for Healthcare Providers: BankingDealers.co.za This test is not yet approved or cleared by the [place] FDA and has been authorized for detection and/or diagnosis of SARS-CoV-2 by FDA under an Emergency Use Authorization (EUA).  This EUA will remain in effect (meaning this test can be used) for the duration of the COVID-19 declaration under Section 564(b)(1) of the Act, 21 U.S.C. section 360bbb-3(b)(1), unless the authorization is terminated or revoked sooner. Performed at [facility], [address], [place], [place] [address]   Culture, blood (Routine X 2) w Reflex to ID Panel     Status: None (Preliminary result)   Collection Time: 07/28/18  8:34 AM   Specimen: BLOOD LEFT HAND  Result Value Ref Range Status   Specimen Description BLOOD LEFT HAND Blood Culture adequate volume  Final   Special Requests   Final    BOTTLES DRAWN AEROBIC AND ANAEROBIC Performed at [facility], [address], [place], [place] [address]    Culture PENDING  Incomplete   Report Status PENDING  Incomplete    Studies/Results: Dg Chest Port 1 View  Result Date: 07/28/2018 CLINICAL DATA:  Dyspnea. Respiratory abnormalities. Placement of endotracheal tube and nasogastric tube. EXAM: PORTABLE CHEST 1 VIEW COMPARISON:  07/27/2018 FINDINGS: Endotracheal tube is 2.6 cm above the carina. Nasogastric tube extends into the abdomen but the tip is beyond the image. Cardiac pad overlying the left chest.  Increased densities at the left lung base suggestive for consolidation and/or pleural fluid. Increased hazy densities at the right lung base. Negative for pneumothorax. Heart size is upper limits of normal and stable. IMPRESSION: 1. Endotracheal tube is appropriately positioned. Nasogastric tube extends into the abdomen. 2. Increased basilar chest densities bilaterally. Findings could represent a combination of consolidation and pleural  fluid. Electronically Signed   By: Markus Daft M.D.   On: 07/28/2018 08:44   Dg Chest Port 1 View  Result Date: 07/27/2018 CLINICAL DATA:  Shortness of breath and decreased oxygen saturation EXAM: PORTABLE CHEST 1 VIEW COMPARISON:  None. FINDINGS: There is cardiomegaly with pulmonary venous hypertension. There is interstitial edema with small pleural effusions. There is no frank airspace consolidation. No adenopathy. There is aortic atherosclerosis. No bone lesions. IMPRESSION: Pulmonary vascular congestion with small pleural effusions and interstitial edema. Suspect a degree of congestive heart failure. No consolidation. Aortic Atherosclerosis (ICD10-I70.0). Electronically Signed   By: Lowella Grip III M.D.   On: 07/27/2018 08:47    Medications:  Prior to Admission:  Medications Prior to Admission  Medication Sig Dispense Refill Last Dose  . albuterol (PROVENTIL HFA;VENTOLIN HFA) 108 (90 Base) MCG/ACT inhaler Inhale into the lungs.   07/27/2018 at Unknown time  . alendronate (FOSAMAX) 70 MG tablet Take 70 mg by mouth every Saturday. Take with a full glass of water on an empty stomach.   07/21/2018  . amLODipine (NORVASC) 5 MG tablet Take 5 mg by mouth daily.   07/27/2018 at Unknown time  . atorvastatin (LIPITOR) 40 MG tablet Take 40 mg by mouth daily.   07/27/2018 at Unknown time  . carvedilol (COREG) 25 MG tablet Take 25 mg by mouth 2 (two) times daily with a meal.   07/27/2018 at 0700  . chlorthalidone (HYGROTON) 25 MG tablet Take 25 mg by mouth daily.   07/27/2018 at  Unknown time  . clopidogrel (PLAVIX) 75 MG tablet Take 1 tablet (75 mg total) by mouth daily. 90 tablet 3 07/27/2018 at 0700 time  . gabapentin (NEURONTIN) 300 MG capsule Take 1 capsule (300 mg total) by mouth 2 (two) times daily. 60 capsule 3 07/27/2018 at Unknown time  . glipiZIDE (GLUCOTROL) 5 MG tablet Take 5 mg by mouth daily before breakfast.    07/27/2018 at Unknown time  . Icosapent Ethyl 1 g CAPS Take 1 capsule by mouth daily.    07/27/2018 at Unknown time  . Insulin Glargine (LANTUS SOLOSTAR) 100 UNIT/ML Solostar Pen Inject 30 Units into the skin at bedtime. 5 pen 2 07/26/2018 at Unknown time  . latanoprost (XALATAN) 0.005 % ophthalmic solution Place 1 drop into both eyes at bedtime.   07/26/2018 at Unknown time  . loratadine (CLARITIN) 10 MG tablet Take 10 mg daily by mouth.   07/27/2018 at Unknown time  . losartan (COZAAR) 100 MG tablet Take 100 mg by mouth daily.   07/27/2018 at Unknown time  . Multiple Vitamin (MULTIVITAMIN WITH MINERALS) TABS tablet Take 1 tablet by mouth daily.   07/27/2018 at Unknown time  . pantoprazole (PROTONIX) 40 MG tablet Take 40 mg daily by mouth.    07/27/2018 at Unknown time  . triamcinolone ointment (KENALOG) 0.1 % APPLY OINTMENT TOPICALLY TO AFFECTED AREA TWICE DAILY   Past Week at Unknown time  . Vitamin D, Ergocalciferol, (DRISDOL) 50000 UNITS CAPS capsule Take 50,000 Units by mouth every Saturday.   07/21/2018   Scheduled: . [START ON 07/29/2018] amLODipine  5 mg Per Tube Daily  . atorvastatin  40 mg Per Tube Daily  . [START ON 07/30/2018] carvedilol  25 mg Per Tube BID WC  . chlorhexidine gluconate (MEDLINE KIT)  15 mL Mouth Rinse BID  . Chlorhexidine Gluconate Cloth  6 each Topical Q0600  . clopidogrel  75 mg Per Tube Daily  . feeding supplement (ENSURE ENLIVE)  237 mL Per  Tube QID  . furosemide  60 mg Intravenous Q12H  . gabapentin  300 mg Per Tube Q12H  . heparin  5,000 Units Subcutaneous Q8H  . insulin aspart  0-15 Units Subcutaneous TID WC  . insulin  aspart  0-5 Units Subcutaneous QHS  . insulin glargine  15 Units Subcutaneous QHS  . ipratropium-albuterol  3 mL Inhalation Q6H  . latanoprost  1 drop Both Eyes QHS  . mouth rinse  15 mL Mouth Rinse 10 times per day  . metolazone  5 mg Oral Once  . multivitamin  15 mL Per Tube Daily  . naloxone      . pantoprazole sodium  40 mg Per Tube Daily  . senna  1 tablet Per Tube QHS  . sodium chloride flush  3 mL Intravenous Q12H  . [START ON 08/04/2018] Vitamin D (Ergocalciferol)  50,000 Units Per Tube Q Sat   Continuous: . sodium chloride    . propofol (DIPRIVAN) infusion 15 mcg/kg/min (07/28/18 0756)   YWV:PXTGGY chloride, acetaminophen **OR** acetaminophen, albuterol, hydrALAZINE, meclizine, midazolam, ondansetron **OR** ondansetron (ZOFRAN) IV, polyethylene glycol, sodium chloride flush, traZODone  Assesment: He has acute hypoxic and hypercapnic respiratory failure.  His chest x-ray that I have personally reviewed could be a combination of infection and heart failure.  On admission I favor heart failure but after his acute event he certainly may have aspirated.  He has sleep apnea and had been refusing CPAP and that complicates his situation.  He has acute on chronic diastolic heart failure  He has diabetes which is not controlled based on hemoglobin A1c.  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  I did not add steroids yet but if he is not improving will need to do that.  His blood gas still shows a relatively low pH so I think we need to increase his respiratory rate.  We will add Zosyn.   This should cover the possibility of aspiration pneumonia  Thanks for allowing me to see him with you    LOS: 1 day   Alonza Bogus 07/28/2018, 11:27 AM

## 2018-07-28 NOTE — ED Provider Notes (Signed)
I was called upstairs for patient needing to be intubated.  Patient has a history of congestive heart failure, sleep apnea, and he had refused CPAP during the night.  When I arrived patient was unresponsive, he was being bagged by respiratory therapy.  INTUBATION Performed by: Janice Norrie  Required items: required blood products, implants, devices, and special equipment available Patient identity confirmed: provided demographic data and hospital-assigned identification number Time out: Immediately prior to procedure a "time out" was called to verify the correct patient, procedure, equipment, support staff and site/side marked as required.  Indications: Respiratory failure  Intubation method: Glidescope Laryngoscopy   Preoxygenation: 100 %BVM  Sedatives: Versed Paralytic:none  Tube Size: 8.0 cuffed with subglottic balloon  Post-procedure assessment: chest rise and ETCO2 monitor Breath sounds: equal and absent over the epigastrium Tube secured with: ETT holder Chest x-ray pending   Patient tolerated the procedure well with no immediate complications.  Rolland Porter, MD, Barbette Or, MD 07/28/18 [id number]

## 2018-07-28 NOTE — Progress Notes (Addendum)
Upon arrival in pt's room pt was lethargic and sweaty.  o2 sat in the 40's.  Respiratory and MD were called and pt was intubated and transferred to ICU room 2.  Daughter informed of pts transfer

## 2018-07-28 NOTE — Progress Notes (Signed)
Pharmacy Antibiotic Note  Carl Miller is a 73 y.o. male admitted on 07/27/2018 with aspiration pna .  Pharmacy has been consulted for zosyn dosing.  Plan: Zosyn 3.375g IV q8h (4 hour infusion).  Height: 6' (182.9 cm) Weight: 242 lb 8.1 oz (110 kg) IBW/kg (Calculated) : 77.6  Temp (24hrs), Avg:96.2 F (35.7 C), Min:94.3 F (34.6 C), Max:98.3 F (36.8 C)  Recent Labs  Lab 07/27/18 0830 07/28/18 0545 07/28/18 0642 07/28/18 1046  WBC 10.0 12.6*  --   --   CREATININE 2.60* 3.11* 3.31*  --   LATICACIDVEN  --   --  0.4* 0.9    Estimated Creatinine Clearance: 25.1 mL/min (A) (by C-G formula based on SCr of 3.31 mg/dL (H)).    Allergies  Allergen Reactions  . Aspirin     Jittery, nausea when take "full dose".  . Tape Other (See Comments)    Latex tape - pulls skin off.  NO latex allergy.    Antimicrobials this admission: 7/11 zosyn >>   Microbiology results: 7/11 BCx: sent  7/11 MRSA PCR: sent 7/11 Covid 19 sent   Thank you for allowing pharmacy to be a part of this patient's care.  Donna Christen Jina Olenick 07/28/2018 11:44 AM

## 2018-07-28 NOTE — Progress Notes (Signed)
Night shift telemetry coverage note  The patient was seen after being found unresponsive in his bed. He was having hypopnea and hypoxia of 56%. He declined using CPAP earlier in the evening. He was Ambu bagged and eventually intubated by Dr. Tomi Bamberger. I transferred the patient to ICU and placed him on mechanical ventilation. Versed 2 mg IVP were given during ET intubation.   07/28/18 06:03:58  97.7 F (36.5 C)  71      143/68Abnormal   Lying  56 %Abnormal   Nasal Cannula  110 kg DC   General.  Unresponsive to verbal stimuli.  Responds briefly to tactile stimuli. Neck.  Supple, no JVD. Lungs.  Bibasilar rales with decreased breath sounds on the left base and bilateral rhonchi. Cardiovascular.  S1, S2, RRR Abdomen.  Obese, soft, nontender. Extremities.  No edema, clubbing and cyanosis.  Acute respiratory failure due to hypopnea Continue mechanical ventilation. His EKG was unchanged. Follow chest radiograph. Follow-up morning labs. Consider CT head.  Tennis Must, MD.  About 50 minutes were spent during the process of this emergent event.  This document was prepared using Dragon voice recognition software and may contain some unintended transcription errors.

## 2018-07-29 ENCOUNTER — Encounter (HOSPITAL_COMMUNITY): Payer: Self-pay | Admitting: Radiology

## 2018-07-29 ENCOUNTER — Inpatient Hospital Stay (HOSPITAL_COMMUNITY): Payer: Medicare Other

## 2018-07-29 LAB — CBC
HCT: 33.5 % — ABNORMAL LOW (ref 39.0–52.0)
Hemoglobin: 9.7 g/dL — ABNORMAL LOW (ref 13.0–17.0)
MCH: 27.8 pg (ref 26.0–34.0)
MCHC: 29 g/dL — ABNORMAL LOW (ref 30.0–36.0)
MCV: 96 fL (ref 80.0–100.0)
Platelets: 191 10*3/uL (ref 150–400)
RBC: 3.49 MIL/uL — ABNORMAL LOW (ref 4.22–5.81)
RDW: 14.9 % (ref 11.5–15.5)
WBC: 10.4 10*3/uL (ref 4.0–10.5)
nRBC: 0 % (ref 0.0–0.2)

## 2018-07-29 LAB — BLOOD GAS, ARTERIAL
Acid-Base Excess: 2 mmol/L (ref 0.0–2.0)
Bicarbonate: 26.3 mmol/L (ref 20.0–28.0)
FIO2: 60
O2 Saturation: 95.9 %
Patient temperature: 37
pCO2 arterial: 38.4 mmHg (ref 32.0–48.0)
pH, Arterial: 7.443 (ref 7.350–7.450)
pO2, Arterial: 83.2 mmHg (ref 83.0–108.0)

## 2018-07-29 LAB — COMPREHENSIVE METABOLIC PANEL
ALT: 17 U/L (ref 0–44)
AST: 13 U/L — ABNORMAL LOW (ref 15–41)
Albumin: 2.6 g/dL — ABNORMAL LOW (ref 3.5–5.0)
Alkaline Phosphatase: 58 U/L (ref 38–126)
Anion gap: 10 (ref 5–15)
BUN: 71 mg/dL — ABNORMAL HIGH (ref 8–23)
CO2: 25 mmol/L (ref 22–32)
Calcium: 8.4 mg/dL — ABNORMAL LOW (ref 8.9–10.3)
Chloride: 109 mmol/L (ref 98–111)
Creatinine, Ser: 3.02 mg/dL — ABNORMAL HIGH (ref 0.61–1.24)
GFR calc Af Amer: 22 mL/min — ABNORMAL LOW (ref >60)
GFR calc non Af Amer: 19 mL/min — ABNORMAL LOW (ref >60)
Glucose, Bld: 258 mg/dL — ABNORMAL HIGH (ref 70–99)
Potassium: 3.5 mmol/L (ref 3.5–5.1)
Sodium: 144 mmol/L (ref 135–145)
Total Bilirubin: 0.4 mg/dL (ref 0.3–1.2)
Total Protein: 5.3 g/dL — ABNORMAL LOW (ref 6.5–8.1)

## 2018-07-29 LAB — GLUCOSE, CAPILLARY
Glucose-Capillary: 186 mg/dL — ABNORMAL HIGH (ref 70–99)
Glucose-Capillary: 197 mg/dL — ABNORMAL HIGH (ref 70–99)
Glucose-Capillary: 209 mg/dL — ABNORMAL HIGH (ref 70–99)
Glucose-Capillary: 256 mg/dL — ABNORMAL HIGH (ref 70–99)
Glucose-Capillary: 257 mg/dL — ABNORMAL HIGH (ref 70–99)
Glucose-Capillary: 281 mg/dL — ABNORMAL HIGH (ref 70–99)
Glucose-Capillary: 285 mg/dL — ABNORMAL HIGH (ref 70–99)
Glucose-Capillary: 297 mg/dL — ABNORMAL HIGH (ref 70–99)

## 2018-07-29 LAB — MAGNESIUM
Magnesium: 2.4 mg/dL (ref 1.7–2.4)
Magnesium: 2.2 mg/dL (ref 1.7–2.4)

## 2018-07-29 LAB — PHOSPHORUS
Phosphorus: 4.4 mg/dL (ref 2.5–4.6)
Phosphorus: 4.5 mg/dL (ref 2.5–4.6)

## 2018-07-29 LAB — TRIGLYCERIDES: Triglycerides: 402 mg/dL — ABNORMAL HIGH (ref <150)

## 2018-07-29 MED ORDER — METHYLPREDNISOLONE SODIUM SUCC 40 MG IJ SOLR
40.0000 mg | Freq: Two times a day (BID) | INTRAMUSCULAR | Status: DC
  Administered 2018-07-29 – 2018-07-30 (×3): 40 mg via INTRAVENOUS
  Filled 2018-07-29 (×3): qty 1

## 2018-07-29 MED ORDER — VITAL HIGH PROTEIN PO LIQD
1000.0000 mL | ORAL | Status: DC
  Administered 2018-07-29 – 2018-07-30 (×2): 1000 mL
  Filled 2018-07-29 (×3): qty 1000

## 2018-07-29 MED ORDER — PRO-STAT SUGAR FREE PO LIQD
30.0000 mL | Freq: Two times a day (BID) | ORAL | Status: DC
  Administered 2018-07-29 – 2018-07-30 (×3): 30 mL
  Filled 2018-07-29 (×3): qty 30

## 2018-07-29 NOTE — Progress Notes (Addendum)
Patient appears to have thick yellow secretions in ET-tube will obtain sputum. Noted beginning of shift patient appears to tremble and tends to turn hands in at time. He has had stroke in past He may have aspirated when he had respiratory failure yesterday morning. But suspect he may have had pneumonia before. Kidney function is down. Albumin 3.2, BNP 348 at arrival, breath sounds mostly decreased. Patient was on 5 liters oxygen on floor does not wear home oxygen. Does not use CPAP though he is suppose to. Did have swelling in legs on arrival in er. No CHF history. He did have temp problems yesterday after Respiratory incident. Saturations while in Respiratory failure yesterday dropped extremely low before Bagging and intubation.  Patient had wet bed with sweat and had abowel movement during event. No compression were performed only bagging for ventilation. No sedation was required for intubation though it was given.

## 2018-07-29 NOTE — Progress Notes (Signed)
RN to give patient medications- measured OGT- out 20cm @ 85- in report I was told 102. OGT tube pushed back in to correct placement. TFs stopped. Dr. Hilbert Bible paged to see if STAT ABD xray could be obtained. Waiting for call back orders.   Pts Rass Score was -2, Propofol decreased to 15mcg. Pt immedietely started shaking and pulling at tube. Increased back to 20mcg Will continue to monitor pt

## 2018-07-29 NOTE — Progress Notes (Signed)
Patient Demographics:    Carl Miller, is a 73 y.o. male, DOB - 1945/12/01, MVV:[id number]  Admit date - 07/27/2018   Admitting Physician Cathyrn Deas Denton Brick, MD  Outpatient Primary MD for the patient is Christain Sacramento, MD  LOS - 2   Chief Complaint  Patient presents with  . Shortness of Breath        Subjective:    [place] intubated and sedated....with sedation vacation he is able to follow command...   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  A/p 1)Acute hypoxic and hypercapnic respiratory failure due to combination of CHF exacerbation and OSA with noncompliance with CPAP and Pneumonia--- currently intubated and ventilated, pulmonary consult from Dr. Luan Pulling appreciated, troponin and lactic acid not elevated --FiO2 is down to 45% on vent,  PRVC Mode/PEEP 5/RR 20/TV 620  2)CAP with possible aspiration event--- patient had episode of hypothermia , then had Fevers, patient is now afebrile WBC is down to 10.4 from 12.6, lactic acid is not elevated, pulmonologist consult from Dr. Luan Pulling appreciated, continue IV Zosyn started on 07/28/2018 for possible aspiration pneumonia,, blood cultures NGTD, , continue respiratory support on the vent, continue bronchodilators --- Repeat chest x-ray on  07/29/2018 with possible pneumonia  3)HFpEF--- acute on chronic preserved EF CHF exacerbation with hypoxia, echo with EF of 60 to 65% on 07/27/2018, troponins are flat,...  Continue diuresis with IV Lasix as ordered, daily weight and fluid input and output monitoring,--weight is down to 238 pounds from 245 pounds couple days ago -Pulmonary venous congestion on lower extremity edema appears to be improving  4)AKi--- last available creatinine is from 12/05/2016 it was 1.0, creatinine on admission was 2.6,...  Creatinine is now up to 3.31, continue to monitor closely ... unable to establish if patient has underlying CKD given lack of recent lab data.... -  Hold losartan and chlorthalidone, hold Aldactone,   5)obesity/OSA----PTA and during hospitalization patient was not compliant with CPAP , now intubated and ventilated  6)HTN---  hold Coreg due to significant bradycardia, c/n amlodipine 5 mg daily,  may use IV Hydralazine 10 mg  Every 4 hours Prn for systolic blood pressure over 160 mmhg  6)DM2--A1c 7.6, reflecting fair diabetic control, stopped glipizide,  decrease Lantus insulin from 30 units qhs, to 10 units... As patient is currently intubated and n.p.o., use Novolog/Humalog Sliding scale insulin with Accu-Cheks/Fingersticks as ordered   7)Social/Ethics--plan of care discussed with patient's son, daughter and grandson at bedside, he remains a full code with no limitations to treatment at this time  CRITICAL CARE Performed by: Roxan Hockey   Total critical care time:  38 minutes  Critical care time was exclusive of separately billable  procedures and treating other patients. Ventilator management ---FiO2 is down to 45% on vent,  PRVC Mode/PEEP 5/RR 20/TV 620  Pt has Acute hypoxic and hypercapnic respiratory failure requiring intubation and sedation, patient had significant bradycardia and hypothermia requiring interventions overall condition is critical, prognosis is guarded  Critical  care was necessary to treat or prevent imminent or life-threatening deterioration.  Critical care was time spent personally by me on the following activities: development of treatment plan with patient and/or surrogate as well as nursing, discussions with consultants, evaluation of patient's response to treatment, examination of patient, obtaining history from patient or surrogate, ordering and performing treatments and interventions, ordering and review of laboratory studies, ordering and review of radiographic studies, pulse oximetry and re-evaluation of patient's condition.   Code Status : Full code  Family Communication:   Son, daughter  and grandson at bedside on 07/28/18, Discussed with daughter on 07/29/18 [phone])  Disposition Plan  : TBD  Consults  :  Pulm  DVT Prophylaxis  :   - Heparin - SCDs   Lab Results  Component Value Date   PLT 191 07/29/2018    Inpatient Medications  Scheduled Meds: . amLODipine  5 mg Per Tube Daily  . atorvastatin  40 mg Per Tube Daily  . [START ON 07/30/2018] carvedilol  25 mg Per Tube BID WC  . chlorhexidine gluconate (MEDLINE KIT)  15 mL Mouth Rinse BID  . chlorhexidine gluconate (MEDLINE KIT)  15 mL Mouth Rinse BID  . Chlorhexidine Gluconate Cloth  6 each Topical Q0600  . clopidogrel  75 mg Per Tube Daily  . feeding supplement (ENSURE ENLIVE)  237 mL Per Tube QID  . feeding supplement (PRO-STAT SUGAR FREE 64)  30 mL Per Tube BID  . feeding supplement (VITAL HIGH PROTEIN)  1,000 mL Per Tube Q24H  . furosemide  60 mg Intravenous Q12H  . gabapentin  300 mg Per Tube Q12H  . heparin  5,000 Units Subcutaneous Q8H  . insulin aspart  0-9 Units Subcutaneous Q4H  . insulin glargine  10 Units Subcutaneous QHS  . ipratropium-albuterol  3 mL Inhalation Q6H  . latanoprost  1 drop Both Eyes QHS  . mouth rinse  15 mL Mouth Rinse 10 times per day  . methylPREDNISolone (SOLU-MEDROL) injection  40 mg Intravenous Q12H  . metolazone  5 mg Oral Once  .  multivitamin  15 mL Per Tube Daily  . pantoprazole sodium  40 mg Per Tube Daily  . senna  1 tablet Per Tube QHS  . sodium chloride flush  3 mL Intravenous Q12H  . [START ON 08/04/2018] Vitamin D (Ergocalciferol)  50,000 Units Per Tube Q Sat   Continuous Infusions: . sodium chloride Stopped (07/28/18 1252)  . piperacillin-tazobactam (ZOSYN)  IV 3.375 g (07/29/18 0615)  . propofol (DIPRIVAN) infusion 25 mcg/kg/min (07/29/18 1113)   PRN Meds:.sodium chloride, acetaminophen **OR** acetaminophen, albuterol, hydrALAZINE, meclizine, midazolam, ondansetron **OR** ondansetron (ZOFRAN) IV, polyethylene glycol, sodium chloride flush, traZODone   Anti-infectives (From admission, onward)   Start     Dose/Rate Route Frequency Ordered Stop   07/28/18 1145  piperacillin-tazobactam (ZOSYN) IVPB 3.375 g     3.375 g 12.5 mL/hr over 240 Minutes Intravenous Every 8 hours 07/28/18 1143          Objective:   Vitals:   07/29/18 1200 07/29/18 1213 07/29/18 1248 07/29/18 1300  BP: (!) 159/71   (!) 150/65  Pulse: 65   (!) 54  Resp: (!) 23   20  Temp: 99.3 F (37.4 C)   99.3 F (37.4 C)  TempSrc:      SpO2: 97% 97% 95% 95%  Weight:      Height:        Wt Readings from Last 3 Encounters:  07/29/18 108.3 kg  08/14/17 104.3 kg  05/15/17 103.7 kg    Intake/Output Summary (Last 24 hours) at 07/29/2018 1410 Last data filed at 07/29/2018 0900 Gross per 24 hour  Intake 734.95 ml  Output 2900 ml  Net -2165.05 ml   Physical Exam Gen:-Intubated and sedated  HEENT:-ET tube and OG tube  Lungs-diminished in bases  with scattered rhonchi, No wheezing CV- S1, S2 normal, regular ,  Abd-  +ve B.Sounds, Abd Soft,   Extremity/Skin:- 2+  edema, pedal pulses present  Psych/Neuro---intubated and sedated   Data Review:   Micro Results Recent Results (from the past 240 hour(s))  SARS Coronavirus 2 (CEPHEID - Performed in [place] hospital lab), Hosp Order     Status: None   Collection Time: 07/27/18   8:29 AM   Specimen: Nasopharyngeal Swab  Result Value Ref Range Status   SARS Coronavirus 2 NEGATIVE NEGATIVE Final    Comment: (NOTE) If result is NEGATIVE SARS-CoV-2 target nucleic acids are NOT DETECTED. The SARS-CoV-2 RNA is generally detectable in upper and lower  respiratory specimens during the acute phase of infection. The lowest  concentration of SARS-CoV-2 viral copies this assay can detect is 250  copies / mL. A negative result does not preclude SARS-CoV-2 infection  and should not be used as the sole basis for treatment or other  patient management decisions.  A negative result may occur with  improper specimen collection / handling, submission of specimen other  than nasopharyngeal swab, presence of viral mutation(s) within the  areas targeted by this assay, and inadequate number of viral copies  (<250 copies / mL). A negative result must be combined with clinical  observations, patient history, and epidemiological information. If result is POSITIVE SARS-CoV-2 target nucleic acids are DETECTED. The SARS-CoV-2 RNA is generally detectable in upper and lower  respiratory specimens dur ing the acute phase of infection.  Positive  results are indicative of active infection with SARS-CoV-2.  Clinical  correlation with patient history and other diagnostic information is  necessary to determine patient infection status.  Positive results do  not rule out bacterial infection or co-infection with other viruses. If result is PRESUMPTIVE POSTIVE SARS-CoV-2 nucleic acids MAY BE PRESENT.   A presumptive positive result was obtained on the submitted specimen  and confirmed on repeat testing.  While 2019 novel coronavirus  (SARS-CoV-2) nucleic acids may be present in the submitted sample  additional confirmatory testing may be necessary for epidemiological  and / or clinical management purposes  to differentiate between  SARS-CoV-2 and other Sarbecovirus currently known to infect  humans.  If clinically indicated additional testing with an alternate test  methodology [phone]) is advised. The SARS-CoV-2 RNA is generally  detectable in upper and lower respiratory sp ecimens during the acute  phase of infection. The expected result is Negative. Fact Sheet for Patients:  StrictlyIdeas.no Fact Sheet for Healthcare Providers: BankingDealers.co.za This test is not yet approved or cleared by the [place] FDA and has been authorized for detection and/or diagnosis of SARS-CoV-2 by FDA under an Emergency Use Authorization (EUA).  This EUA will remain in effect (meaning this test can be used) for the duration of the COVID-19 declaration under Section 564(b)(1) of the Act,  21 U.S.C. section 360bbb-3(b)(1), unless the authorization is terminated or revoked sooner. Performed at [facility], [address] [place], [place] [address]   MRSA PCR Screening     Status: None   Collection Time: 07/28/18  6:54 AM   Specimen: Nasal Mucosa; Nasopharyngeal  Result Value Ref Range Status   MRSA by PCR NEGATIVE NEGATIVE Final    Comment:        The GeneXpert MRSA Assay (FDA approved for NASAL specimens only), is one component of a comprehensive MRSA colonization surveillance program. It is not intended to diagnose MRSA infection nor to guide or monitor treatment for MRSA infections. Performed at [facility], [address], [place], [place] [address]   Culture, blood (Routine X 2) w Reflex to ID Panel     Status: None (Preliminary result)   Collection Time: 07/28/18  8:34 AM   Specimen: BLOOD LEFT HAND  Result Value Ref Range Status   Specimen Description BLOOD LEFT HAND Blood Culture adequate volume  Final   Special Requests BOTTLES DRAWN AEROBIC AND ANAEROBIC  Final   Culture   Final    NO GROWTH < 24 HOURS Performed at [facility], [address], [place], [place] [address]    Report Status PENDING  Incomplete   Culture, blood (Routine X 2) w Reflex to ID Panel     Status: None (Preliminary result)   Collection Time: 07/28/18  6:42 PM   Specimen: BLOOD LEFT ARM  Result Value Ref Range Status   Specimen Description BLOOD LEFT ARM  Final   Special Requests   Final    BOTTLES DRAWN AEROBIC ONLY Blood Culture adequate volume   Culture   Final    NO GROWTH < 12 HOURS Performed at [facility], [address] [place], [place] [address]    Report Status PENDING  Incomplete    Radiology Reports Dg Chest Port 1 View  Result Date: 07/29/2018 CLINICAL DATA:  Respiratory failure. EXAM: PORTABLE CHEST 1 VIEW COMPARISON:  07/28/2018 FINDINGS: Lung volumes remain low. There is lung base opacity that is similar to the prior study, consistent with bilateral effusions with either atelectasis or infection. Endotracheal tube and nasal/orogastric tube are stable in well positioned. IMPRESSION: 1. No significant change from the most recent prior study allowing for differences in lung volume and patient positioning. 2. Bilateral pleural effusions with persistent lung base opacities, the latter finding which may reflect atelectasis, pneumonia or a combination. 3. Stable support apparatus. Electronically Signed   By: Lajean Manes M.D.   On: 07/29/2018 08:43   Dg Chest Port 1 View  Result Date: 07/28/2018 CLINICAL DATA:  Dyspnea. Respiratory abnormalities. Placement of endotracheal tube and nasogastric tube. EXAM: PORTABLE CHEST 1 VIEW COMPARISON:  07/27/2018 FINDINGS: Endotracheal tube is 2.6 cm above the carina. Nasogastric tube extends into the abdomen but the tip is beyond the image. Cardiac pad overlying the left chest. Increased densities at the left lung base suggestive for consolidation and/or pleural fluid. Increased hazy densities at the right lung base. Negative for pneumothorax. Heart size is upper limits of normal and stable. IMPRESSION: 1. Endotracheal tube is appropriately positioned. Nasogastric tube extends  into the abdomen. 2. Increased basilar chest densities bilaterally. Findings could represent a combination of consolidation and pleural fluid. Electronically Signed   By: Markus Daft M.D.   On: 07/28/2018 08:44   Dg Chest Port 1 View  Result Date: 07/27/2018 CLINICAL DATA:  Shortness of breath and decreased oxygen saturation EXAM: PORTABLE CHEST  1 VIEW COMPARISON:  None. FINDINGS: There is cardiomegaly with pulmonary venous hypertension. There is interstitial edema with small pleural effusions. There is no frank airspace consolidation. No adenopathy. There is aortic atherosclerosis. No bone lesions. IMPRESSION: Pulmonary vascular congestion with small pleural effusions and interstitial edema. Suspect a degree of congestive heart failure. No consolidation. Aortic Atherosclerosis (ICD10-I70.0). Electronically Signed   By: Lowella Grip III M.D.   On: 07/27/2018 08:47     CBC Recent Labs  Lab 07/27/18 0830 07/28/18 0545 07/29/18 0339  WBC 10.0 12.6* 10.4  HGB 10.5* 10.5* 9.7*  HCT 36.3* 37.9* 33.5*  PLT 195 195 191  MCV 98.4 103.0* 96.0  MCH 28.5 28.5 27.8  MCHC 28.9* 27.7* 29.0*  RDW 14.7 14.7 14.9  LYMPHSABS 1.2  --   --   MONOABS 0.9  --   --   EOSABS 0.1  --   --   BASOSABS 0.0  --   --     Chemistries  Recent Labs  Lab 07/27/18 0830 07/28/18 0545 07/28/18 0642 07/29/18 0339 07/29/18 0706  NA 142 144 143 144  --   K 4.5 5.2* 5.5* 3.5  --   CL 111 110 110 109  --   CO2 _0 --   GLUCOSE 159* 224* 238* 258*  --   BUN 55* 65* 63* 71*  --   CREATININE 2.60* 3.11* 3.31* 3.02*  --   CALCIUM 8.6* 8.5* 8.5* 8.4*  --   MG  --   --  2.5*  --  2.4  AST  --   --  15 13*  --   ALT  --   --  20 17  --   ALKPHOS  --   --  77 58  --   BILITOT  --   --  0.3 0.4  --    ------------------------------------------------------------------------------------------------------------------ Recent Labs    07/29/18 0339  TRIG 402*    Lab Results  Component Value Date    HGBA1C 7.6 (H) 07/27/2018   ------------------------------------------------------------------------------------------------------------------ No results for input(s): TSH, T4TOTAL, T3FREE, THYROIDAB in the last 72 hours.  Invalid input(s): FREET3 ------------------------------------------------------------------------------------------------------------------ No results for input(s): VITAMINB12, FOLATE, FERRITIN, TIBC, IRON, RETICCTPCT in the last 72 hours.  Coagulation profile No results for input(s): INR, PROTIME in the last 168 hours.  No results for input(s): DDIMER in the last 72 hours.  Cardiac Enzymes No results for input(s): CKMB, TROPONINI, MYOGLOBIN in the last 168 hours.  Invalid input(s): CK ------------------------------------------------------------------------------------------------------------------    Component Value Date/Time   BNP 348.0 (H) 07/27/2018 0830   Roxan Hockey M.D on 07/29/2018 at 2:10 PM  Go to [email or web address] - for contact info  [facility] - Office  [phone]

## 2018-07-29 NOTE — Progress Notes (Addendum)
Subjective: He remains intubated and on the ventilator.  Secretions are better.  Still on 60% oxygen.  Chest x-ray this morning that I have personally reviewed shows significantly different technique but may be a little worse  Objective: Vital signs in last 24 hours: Temp:  [94.3 F (34.6 C)-101.1 F (38.4 C)] 98.4 F (36.9 C) (07/12 0530) Pulse Rate:  [48-81] 52 (07/12 0530) Resp:  [0-22] 0 (07/12 0530) BP: (110-168)/(59-103) 148/61 (07/12 0530) SpO2:  [96 %-100 %] 98 % (07/12 0530) FiO2 (%):  [50 %-100 %] 50 % (07/12 0430) Weight:  [108.3 kg] 108.3 kg (07/12 0500) Weight change: -2.831 kg Last BM Date: 07/28/18  Intake/Output from previous day: 07/11 0701 - 07/12 0700 In: 711.4 [I.V.:311.5; NG/GT:300; IV Piggyback:99.9] Out: 2900 [Urine:2900]  PHYSICAL EXAM General appearance: Intubated sedated on mechanical ventilation Resp: rhonchi bilaterally Cardio: regular rate and rhythm, S1, S2 normal, no murmur, click, rub or gallop GI: soft, non-tender; bowel sounds normal; no masses,  no organomegaly Extremities: extremities normal, atraumatic, no cyanosis or edema  Lab Results:  Results for orders placed or performed during the hospital encounter of 07/27/18 (from the past 48 hour(s))  SARS Coronavirus 2 (CEPHEID - Performed in [place] hospital lab), Hosp Order     Status: None   Collection Time: 07/27/18  8:29 AM   Specimen: Nasopharyngeal Swab  Result Value Ref Range   SARS Coronavirus 2 NEGATIVE NEGATIVE    Comment: (NOTE) If result is NEGATIVE SARS-CoV-2 target nucleic acids are NOT DETECTED. The SARS-CoV-2 RNA is generally detectable in upper and lower  respiratory specimens during the acute phase of infection. The lowest  concentration of SARS-CoV-2 viral copies this assay can detect is 250  copies / mL. A negative result does not preclude SARS-CoV-2 infection  and should not be used as the sole basis for treatment or other  patient management decisions.  A  negative result may occur with  improper specimen collection / handling, submission of specimen other  than nasopharyngeal swab, presence of viral mutation(s) within the  areas targeted by this assay, and inadequate number of viral copies  (<250 copies / mL). A negative result must be combined with clinical  observations, patient history, and epidemiological information. If result is POSITIVE SARS-CoV-2 target nucleic acids are DETECTED. The SARS-CoV-2 RNA is generally detectable in upper and lower  respiratory specimens dur ing the acute phase of infection.  Positive  results are indicative of active infection with SARS-CoV-2.  Clinical  correlation with patient history and other diagnostic information is  necessary to determine patient infection status.  Positive results do  not rule out bacterial infection or co-infection with other viruses. If result is PRESUMPTIVE POSTIVE SARS-CoV-2 nucleic acids MAY BE PRESENT.   A presumptive positive result was obtained on the submitted specimen  and confirmed on repeat testing.  While 2019 novel coronavirus  (SARS-CoV-2) nucleic acids may be present in the submitted sample  additional confirmatory testing may be necessary for epidemiological  and / or clinical management purposes  to differentiate between  SARS-CoV-2 and other Sarbecovirus currently known to infect humans.  If clinically indicated additional testing with an alternate test  methodology [phone]) is advised. The SARS-CoV-2 RNA is generally  detectable in upper and lower respiratory sp ecimens during the acute  phase of infection. The expected result is Negative. Fact Sheet for Patients:  StrictlyIdeas.no Fact Sheet for Healthcare Providers: BankingDealers.co.za This test is not yet approved or cleared by the [place] FDA and  has been authorized for detection and/or diagnosis of SARS-CoV-2 by FDA under an Emergency Use  Authorization (EUA).  This EUA will remain in effect (meaning this test can be used) for the duration of the COVID-19 declaration under Section 564(b)(1) of the Act, 21 U.S.C. section 360bbb-3(b)(1), unless the authorization is terminated or revoked sooner. Performed at [facility], [address] [place], [place] [address]   Basic metabolic panel     Status: Abnormal   Collection Time: 07/27/18  8:30 AM  Result Value Ref Range   Sodium 142 135 - 145 mmol/L   Potassium 4.5 3.5 - 5.1 mmol/L   Chloride 111 98 - 111 mmol/L   CO2 24 22 - 32 mmol/L   Glucose, Bld 159 (H) 70 - 99 mg/dL   BUN 55 (H) 8 - 23 mg/dL   Creatinine, Ser 2.60 (H) 0.61 - 1.24 mg/dL   Calcium 8.6 (L) 8.9 - 10.3 mg/dL   GFR calc non Af Amer 23 (L) >60 mL/min   GFR calc Af Amer 27 (L) >60 mL/min   Anion gap 7 5 - 15    Comment: Performed at [facility], [address], [place], [place] [address]  Brain natriuretic peptide     Status: Abnormal   Collection Time: 07/27/18  8:30 AM  Result Value Ref Range   B Natriuretic Peptide 348.0 (H) 0.0 - 100.0 pg/mL    Comment: Performed at [facility], [address] [place], [place] [address]  Troponin I (High Sensitivity)     Status: Abnormal   Collection Time: 07/27/18  8:30 AM  Result Value Ref Range   Troponin I (High Sensitivity) 19.00 (H) <18 ng/L    Comment: (NOTE) Elevated high sensitivity troponin I (hsTnI) values and significant  changes across serial measurements may suggest ACS but many other  chronic and acute conditions are known to elevate hsTnI results.  Refer to the "Links" section for chest pain algorithms and additional  guidance. Performed at [facility], [address], [place], [place] [address]   CBC with Differential     Status: Abnormal   Collection Time: 07/27/18  8:30 AM  Result Value Ref Range   WBC 10.0 4.0 - 10.5 K/uL   RBC 3.69 (L) 4.22 - 5.81 MIL/uL   Hemoglobin 10.5 (L) 13.0 - 17.0 g/dL   HCT 36.3 (L) 39.0 - 52.0 %   MCV 98.4 80.0 -  100.0 fL   MCH 28.5 26.0 - 34.0 pg   MCHC 28.9 (L) 30.0 - 36.0 g/dL   RDW 14.7 11.5 - 15.5 %   Platelets 195 150 - 400 K/uL   nRBC 0.3 (H) 0.0 - 0.2 %   Neutrophils Relative % 77 %   Neutro Abs 7.7 1.7 - 7.7 K/uL   Lymphocytes Relative 12 %   Lymphs Abs 1.2 0.7 - 4.0 K/uL   Monocytes Relative 9 %   Monocytes Absolute 0.9 0.1 - 1.0 K/uL   Eosinophils Relative 1 %   Eosinophils Absolute 0.1 0.0 - 0.5 K/uL   Basophils Relative 0 %   Basophils Absolute 0.0 0.0 - 0.1 K/uL   Immature Granulocytes 1 %   Abs Immature Granulocytes 0.10 (H) 0.00 - 0.07 K/uL    Comment: Performed at [facility], [address] [place], [place] [address]  Troponin I (High Sensitivity)     Status: Abnormal   Collection Time: 07/27/18  9:56 AM  Result Value Ref Range   Troponin I (High Sensitivity) 19.00 (H) <18  ng/L    Comment: (NOTE) Elevated high sensitivity troponin I (hsTnI) values and significant  changes across serial measurements may suggest ACS but many other  chronic and acute conditions are known to elevate hsTnI results.  Refer to the "Links" section for chest pain algorithms and additional  guidance. Performed at [facility], [address], [place], [place] [address]   Hemoglobin A1c     Status: Abnormal   Collection Time: 07/27/18 10:50 AM  Result Value Ref Range   Hgb A1c MFr Bld 7.6 (H) 4.8 - 5.6 %    Comment: (NOTE) Pre diabetes:          5.7%-6.4% Diabetes:              >6.4% Glycemic control for   <7.0% adults with diabetes    Mean Plasma Glucose 171.42 mg/dL    Comment: Performed at [facility] [address], [place], [place] [address]  Glucose, capillary     Status: Abnormal   Collection Time: 07/27/18 12:00 PM  Result Value Ref Range   Glucose-Capillary 124 (H) 70 - 99 mg/dL  Glucose, capillary     Status: Abnormal   Collection Time: 07/27/18  4:16 PM  Result Value Ref Range   Glucose-Capillary 207 (H) 70 - 99 mg/dL  Urinalysis, Routine w reflex microscopic      Status: Abnormal   Collection Time: 07/27/18  7:29 PM  Result Value Ref Range   Color, Urine YELLOW YELLOW   APPearance HAZY (A) CLEAR   Specific Gravity, Urine 1.012 1.005 - 1.030   pH 5.0 5.0 - 8.0   Glucose, UA 150 (A) NEGATIVE mg/dL   Hgb urine dipstick NEGATIVE NEGATIVE   Bilirubin Urine NEGATIVE NEGATIVE   Ketones, ur NEGATIVE NEGATIVE mg/dL   Protein, ur >=300 (A) NEGATIVE mg/dL   Nitrite NEGATIVE NEGATIVE   Leukocytes,Ua NEGATIVE NEGATIVE   RBC / HPF 0-5 0 - 5 RBC/hpf   WBC, UA 0-5 0 - 5 WBC/hpf   Bacteria, UA RARE (A) NONE SEEN   Hyaline Casts, UA PRESENT     Comment: Performed at [facility], [address] [place], [place] [address]  Glucose, capillary     Status: Abnormal   Collection Time: 07/27/18  9:05 PM  Result Value Ref Range   Glucose-Capillary 163 (H) 70 - 99 mg/dL   Comment 1 Notify RN    Comment 2 Document in Chart   Basic metabolic panel     Status: Abnormal   Collection Time: 07/28/18  5:45 AM  Result Value Ref Range   Sodium 144 135 - 145 mmol/L   Potassium 5.2 (H) 3.5 - 5.1 mmol/L   Chloride 110 98 - 111 mmol/L   CO2 27 22 - 32 mmol/L   Glucose, Bld 224 (H) 70 - 99 mg/dL   BUN 65 (H) 8 - 23 mg/dL   Creatinine, Ser 3.11 (H) 0.61 - 1.24 mg/dL   Calcium 8.5 (L) 8.9 - 10.3 mg/dL   GFR calc non Af Amer 19 (L) >60 mL/min   GFR calc Af Amer 22 (L) >60 mL/min   Anion gap 7 5 - 15    Comment: Performed at [facility], [address], [place], [place] [address]  CBC     Status: Abnormal   Collection Time: 07/28/18  5:45 AM  Result Value Ref Range   WBC 12.6 (H) 4.0 - 10.5 K/uL   RBC 3.68 (L) 4.22 - 5.81 MIL/uL   Hemoglobin 10.5 (L)  13.0 - 17.0 g/dL   HCT 37.9 (L) 39.0 - 52.0 %   MCV 103.0 (H) 80.0 - 100.0 fL   MCH 28.5 26.0 - 34.0 pg   MCHC 27.7 (L) 30.0 - 36.0 g/dL   RDW 14.7 11.5 - 15.5 %   Platelets 195 150 - 400 K/uL   nRBC 0.3 (H) 0.0 - 0.2 %    Comment: Performed at [facility], [address] [place], [place] [address]  Glucose, capillary      Status: Abnormal   Collection Time: 07/28/18  6:17 AM  Result Value Ref Range   Glucose-Capillary 188 (H) 70 - 99 mg/dL   Comment 1 Notify RN    Comment 2 Document in Chart   Magnesium     Status: Abnormal   Collection Time: 07/28/18  6:42 AM  Result Value Ref Range   Magnesium 2.5 (H) 1.7 - 2.4 mg/dL    Comment: Performed at [facility], [address], [place], [place] [address]  Troponin I (High Sensitivity)     Status: None   Collection Time: 07/28/18  6:42 AM  Result Value Ref Range   Troponin I (High Sensitivity) 16.00 <18 ng/L    Comment: (NOTE) Elevated high sensitivity troponin I (hsTnI) values and significant  changes across serial measurements may suggest ACS but many other  chronic and acute conditions are known to elevate hsTnI results.  Refer to the "Links" section for chest pain algorithms and additional  guidance. Performed at [facility], [address], [place], [place] [address]   Lactic acid, plasma     Status: Abnormal   Collection Time: 07/28/18  6:42 AM  Result Value Ref Range   Lactic Acid, Venous 0.4 (L) 0.5 - 1.9 mmol/L    Comment: Performed at [facility], [address], [place], [place] [address]  Hepatic function panel     Status: Abnormal   Collection Time: 07/28/18  6:42 AM  Result Value Ref Range   Total Protein 6.6 6.5 - 8.1 g/dL   Albumin 3.2 (L) 3.5 - 5.0 g/dL   AST 15 15 - 41 U/L   ALT 20 0 - 44 U/L   Alkaline Phosphatase 77 38 - 126 U/L   Total Bilirubin 0.3 0.3 - 1.2 mg/dL   Bilirubin, Direct 0.1 0.0 - 0.2 mg/dL   Indirect Bilirubin 0.2 (L) 0.3 - 0.9 mg/dL    Comment: Performed at [facility], [address] [place], [place] [address]  Basic metabolic panel     Status: Abnormal   Collection Time: 07/28/18  6:42 AM  Result Value Ref Range   Sodium 143 135 - 145 mmol/L   Potassium 5.5 (H) 3.5 - 5.1 mmol/L   Chloride 110 98 - 111 mmol/L   CO2 25 22 - 32 mmol/L   Glucose, Bld 238 (H) 70 - 99 mg/dL   BUN 63 (H) 8 - 23 mg/dL    Creatinine, Ser 3.31 (H) 0.61 - 1.24 mg/dL   Calcium 8.5 (L) 8.9 - 10.3 mg/dL   GFR calc non Af Amer 17 (L) >60 mL/min   GFR calc Af Amer 20 (L) >60 mL/min   Anion gap 8 5 - 15    Comment: Performed at [facility], [address] [place], [place] [address]  MRSA PCR Screening     Status: None   Collection Time: 07/28/18  6:54 AM   Specimen: Nasal Mucosa; Nasopharyngeal  Result Value Ref Range   MRSA by PCR NEGATIVE NEGATIVE  Comment:        The GeneXpert MRSA Assay (FDA approved for NASAL specimens only), is one component of a comprehensive MRSA colonization surveillance program. It is not intended to diagnose MRSA infection nor to guide or monitor treatment for MRSA infections. Performed at [facility], [address] [place], [place] [address]   Glucose, capillary     Status: Abnormal   Collection Time: 07/28/18  8:00 AM  Result Value Ref Range   Glucose-Capillary 201 (H) 70 - 99 mg/dL  Culture, blood (Routine X 2) w Reflex to ID Panel     Status: None (Preliminary result)   Collection Time: 07/28/18  8:34 AM   Specimen: BLOOD LEFT HAND  Result Value Ref Range   Specimen Description BLOOD LEFT HAND Blood Culture adequate volume    Special Requests BOTTLES DRAWN AEROBIC AND ANAEROBIC    Culture      NO GROWTH < 24 HOURS Performed at [facility], [address] [place], [place] [address]    Report Status PENDING   Blood gas, arterial     Status: Abnormal   Collection Time: 07/28/18  8:55 AM  Result Value Ref Range   FIO2 100.00    pH, Arterial 7.251 (L) 7.350 - 7.450   pCO2 arterial 59.6 (H) 32.0 - 48.0 mmHg   pO2, Arterial 42.3 (L) 83.0 - 108.0 mmHg   Bicarbonate 22.4 20.0 - 28.0 mmol/L   Acid-base deficit 1.0 0.0 - 2.0 mmol/L   O2 Saturation 76.5 %   Patient temperature 35.0    Allens test (pass/fail) PASS PASS    Comment: Performed at [facility], [address], [place], [place] [address]  Blood gas, arterial     Status: Abnormal   Collection Time:  07/28/18 10:28 AM  Result Value Ref Range   FIO2 80.00    pH, Arterial 7.284 (L) 7.350 - 7.450   pCO2 arterial 53.3 (H) 32.0 - 48.0 mmHg   pO2, Arterial 88.2 83.0 - 108.0 mmHg   Bicarbonate 22.8 20.0 - 28.0 mmol/L   Acid-base deficit 1.3 0.0 - 2.0 mmol/L   O2 Saturation 96.6 %   Patient temperature 35.5    Allens test (pass/fail) PASS PASS    Comment: Performed at [facility], [address] [place], [place] [address]  Lactic acid, plasma     Status: None   Collection Time: 07/28/18 10:46 AM  Result Value Ref Range   Lactic Acid, Venous 0.9 0.5 - 1.9 mmol/L    Comment: Performed at [facility], [address] [place], [place] [address]  Glucose, capillary     Status: Abnormal   Collection Time: 07/28/18 11:17 AM  Result Value Ref Range   Glucose-Capillary 177 (H) 70 - 99 mg/dL  Troponin I (High Sensitivity)     Status: None   Collection Time: 07/28/18  1:06 PM  Result Value Ref Range   Troponin I (High Sensitivity) 15.00 <18 ng/L    Comment: (NOTE) Elevated high sensitivity troponin I (hsTnI) values and significant  changes across serial measurements may suggest ACS but many other  chronic and acute conditions are known to elevate hsTnI results.  Refer to the "Links" section for chest pain algorithms and additional  guidance. Performed at [facility], [address], [place], [place] [address]   Glucose, capillary     Status: Abnormal   Collection Time: 07/28/18  4:27 PM  Result Value Ref Range   Glucose-Capillary 170 (H) 70 - 99 mg/dL  Culture, blood (  Routine X 2) w Reflex to ID Panel     Status: None (Preliminary result)   Collection Time: 07/28/18  6:42 PM   Specimen: BLOOD LEFT ARM  Result Value Ref Range   Specimen Description BLOOD LEFT ARM    Special Requests      BOTTLES DRAWN AEROBIC ONLY Blood Culture adequate volume   Culture      NO GROWTH < 12 HOURS Performed at [facility], [address], [place], [place] [address]    Report Status PENDING   Glucose,  capillary     Status: Abnormal   Collection Time: 07/28/18  8:03 PM  Result Value Ref Range   Glucose-Capillary 236 (H) 70 - 99 mg/dL  Glucose, capillary     Status: Abnormal   Collection Time: 07/29/18 12:33 AM  Result Value Ref Range   Glucose-Capillary 257 (H) 70 - 99 mg/dL  Blood gas, arterial     Status: None   Collection Time: 07/29/18  2:30 AM  Result Value Ref Range   FIO2 60.00    pH, Arterial 7.443 7.350 - 7.450   pCO2 arterial 38.4 32.0 - 48.0 mmHg   pO2, Arterial 83.2 83.0 - 108.0 mmHg   Bicarbonate 26.3 20.0 - 28.0 mmol/L   Acid-Base Excess 2.0 0.0 - 2.0 mmol/L   O2 Saturation 95.9 %   Patient temperature 37.0    Allens test (pass/fail) PASS PASS    Comment: Performed at [facility], [address] [place], [place] [address]  Glucose, capillary     Status: Abnormal   Collection Time: 07/29/18  3:33 AM  Result Value Ref Range   Glucose-Capillary 209 (H) 70 - 99 mg/dL  CBC     Status: Abnormal   Collection Time: 07/29/18  3:39 AM  Result Value Ref Range   WBC 10.4 4.0 - 10.5 K/uL   RBC 3.49 (L) 4.22 - 5.81 MIL/uL   Hemoglobin 9.7 (L) 13.0 - 17.0 g/dL   HCT 33.5 (L) 39.0 - 52.0 %   MCV 96.0 80.0 - 100.0 fL   MCH 27.8 26.0 - 34.0 pg   MCHC 29.0 (L) 30.0 - 36.0 g/dL   RDW 14.9 11.5 - 15.5 %   Platelets 191 150 - 400 K/uL   nRBC 0.0 0.0 - 0.2 %    Comment: Performed at [facility], [address], [place], [place] [address]  Comprehensive metabolic panel     Status: Abnormal   Collection Time: 07/29/18  3:39 AM  Result Value Ref Range   Sodium 144 135 - 145 mmol/L   Potassium 3.5 3.5 - 5.1 mmol/L    Comment: DELTA CHECK NOTED   Chloride 109 98 - 111 mmol/L   CO2 25 22 - 32 mmol/L   Glucose, Bld 258 (H) 70 - 99 mg/dL   BUN 71 (H) 8 - 23 mg/dL   Creatinine, Ser 3.02 (H) 0.61 - 1.24 mg/dL   Calcium 8.4 (L) 8.9 - 10.3 mg/dL   Total Protein 5.3 (L) 6.5 - 8.1 g/dL   Albumin 2.6 (L) 3.5 - 5.0 g/dL   AST 13 (L) 15 - 41 U/L   ALT 17 0 - 44 U/L   Alkaline  Phosphatase 58 38 - 126 U/L   Total Bilirubin 0.4 0.3 - 1.2 mg/dL   GFR calc non Af Amer 19 (L) >60 mL/min   GFR calc Af Amer 22 (L) >60 mL/min   Anion gap 10 5 - 15    Comment: Performed at [facility], [address], [place], [place] [address]    ABGS Recent Labs    07/29/18 0230  PHART 7.443  PO2ART 83.2  HCO3 26.3   CULTURES Recent Results (from the past 240 hour(s))  SARS Coronavirus 2 (CEPHEID - Performed in [place] hospital lab), Hosp Order     Status: None   Collection Time: 07/27/18  8:29 AM   Specimen: Nasopharyngeal Swab  Result Value Ref Range Status   SARS Coronavirus 2 NEGATIVE NEGATIVE Final    Comment: (NOTE) If result is NEGATIVE SARS-CoV-2 target nucleic acids are NOT DETECTED. The SARS-CoV-2 RNA is generally detectable in upper and lower  respiratory specimens during the acute phase of infection. The lowest  concentration of SARS-CoV-2 viral copies this assay can detect is 250  copies / mL. A negative result does not preclude SARS-CoV-2 infection  and should not be used as the sole basis for treatment or other  patient management decisions.  A negative result may occur with  improper specimen collection / handling, submission of specimen other  than nasopharyngeal swab, presence of viral mutation(s) within the  areas targeted by this assay, and inadequate number of viral copies  (<250 copies / mL). A negative result must be combined with clinical  observations, patient history, and epidemiological information. If result is POSITIVE SARS-CoV-2 target nucleic acids are DETECTED. The SARS-CoV-2 RNA is generally detectable in upper and lower  respiratory specimens dur ing the acute phase of infection.  Positive  results are indicative of active infection with SARS-CoV-2.  Clinical  correlation with patient history and other diagnostic information is  necessary to determine patient infection status.  Positive results do  not rule out bacterial infection  or co-infection with other viruses. If result is PRESUMPTIVE POSTIVE SARS-CoV-2 nucleic acids MAY BE PRESENT.   A presumptive positive result was obtained on the submitted specimen  and confirmed on repeat testing.  While 2019 novel coronavirus  (SARS-CoV-2) nucleic acids may be present in the submitted sample  additional confirmatory testing may be necessary for epidemiological  and / or clinical management purposes  to differentiate between  SARS-CoV-2 and other Sarbecovirus currently known to infect humans.  If clinically indicated additional testing with an alternate test  methodology [phone]) is advised. The SARS-CoV-2 RNA is generally  detectable in upper and lower respiratory sp ecimens during the acute  phase of infection. The expected result is Negative. Fact Sheet for Patients:  StrictlyIdeas.no Fact Sheet for Healthcare Providers: BankingDealers.co.za This test is not yet approved or cleared by the [place] FDA and has been authorized for detection and/or diagnosis of SARS-CoV-2 by FDA under an Emergency Use Authorization (EUA).  This EUA will remain in effect (meaning this test can be used) for the duration of the COVID-19 declaration under Section 564(b)(1) of the Act, 21 U.S.C. section 360bbb-3(b)(1), unless the authorization is terminated or revoked sooner. Performed at [facility], [address] [place], [place] [address]   MRSA PCR Screening     Status: None   Collection Time: 07/28/18  6:54 AM   Specimen: Nasal Mucosa; Nasopharyngeal  Result Value Ref Range Status   MRSA by PCR NEGATIVE NEGATIVE Final    Comment:        The GeneXpert MRSA Assay (FDA approved for NASAL specimens only), is one component of a comprehensive MRSA colonization surveillance program. It is not intended to diagnose MRSA infection nor to guide or monitor treatment for MRSA infections. Performed at [facility], [phone]  [address], [place], [place] [address]   Culture, blood (Routine X 2) w Reflex to ID Panel     Status: None (Preliminary result)   Collection Time: 07/28/18  8:34 AM   Specimen: BLOOD LEFT HAND  Result Value Ref Range Status   Specimen Description BLOOD LEFT HAND Blood Culture adequate volume  Final   Special Requests BOTTLES DRAWN AEROBIC AND ANAEROBIC  Final   Culture   Final    NO GROWTH < 24 HOURS Performed at [facility], [address] [place], [place] [address]    Report Status PENDING  Incomplete  Culture, blood (Routine X 2) w Reflex to ID Panel     Status: None (Preliminary result)   Collection Time: 07/28/18  6:42 PM   Specimen: BLOOD LEFT ARM  Result Value Ref Range Status   Specimen Description BLOOD LEFT ARM  Final   Special Requests   Final    BOTTLES DRAWN AEROBIC ONLY Blood Culture adequate volume   Culture   Final    NO GROWTH < 12 HOURS Performed at [facility], [address] [place],  [address]    Report Status PENDING  Incomplete   Studies/Results: Dg Chest Port 1 View  Result Date: 07/28/2018 CLINICAL DATA:  Dyspnea. Respiratory abnormalities. Placement of endotracheal tube and nasogastric tube. EXAM: PORTABLE CHEST 1 VIEW COMPARISON:  07/27/2018 FINDINGS: Endotracheal tube is 2.6 cm above the carina. Nasogastric tube extends into the abdomen but the tip is beyond the image. Cardiac pad overlying the left chest. Increased densities at the left lung base suggestive for consolidation and/or pleural fluid. Increased hazy densities at the right lung base. Negative for pneumothorax. Heart size is upper limits of normal and stable. IMPRESSION: 1. Endotracheal tube is appropriately positioned. Nasogastric tube extends into the abdomen. 2. Increased basilar chest densities bilaterally. Findings could represent a combination of consolidation and pleural fluid. Electronically Signed   By: Markus Daft M.D.   On: 07/28/2018 08:44   Dg Chest Port 1 View  Result Date:  07/27/2018 CLINICAL DATA:  Shortness of breath and decreased oxygen saturation EXAM: PORTABLE CHEST 1 VIEW COMPARISON:  None. FINDINGS: There is cardiomegaly with pulmonary venous hypertension. There is interstitial edema with small pleural effusions. There is no frank airspace consolidation. No adenopathy. There is aortic atherosclerosis. No bone lesions. IMPRESSION: Pulmonary vascular congestion with small pleural effusions and interstitial edema. Suspect a degree of congestive heart failure. No consolidation. Aortic Atherosclerosis (ICD10-I70.0). Electronically Signed   By: Lowella Grip III M.D.   On: 07/27/2018 08:47    Medications:  Prior to Admission:  Medications Prior to Admission  Medication Sig Dispense Refill Last Dose  . albuterol (PROVENTIL HFA;VENTOLIN HFA) 108 (90 Base) MCG/ACT inhaler Inhale into the lungs.   07/27/2018 at Unknown time  . alendronate (FOSAMAX) 70 MG tablet Take 70 mg by mouth every Saturday. Take with a full glass of water on an empty stomach.   07/21/2018  . amLODipine (NORVASC) 5 MG tablet Take 5 mg by mouth daily.   07/27/2018 at Unknown time  . atorvastatin (LIPITOR) 40 MG tablet Take 40 mg by mouth daily.   07/27/2018 at Unknown time  . carvedilol (COREG) 25 MG tablet Take 25 mg by mouth 2 (two) times daily with a meal.   07/27/2018 at 0700  . chlorthalidone (HYGROTON) 25 MG tablet Take 25 mg by mouth daily.   07/27/2018 at Unknown time  . clopidogrel (PLAVIX) 75 MG tablet Take 1 tablet (75 mg  total) by mouth daily. 90 tablet 3 07/27/2018 at 0700 time  . gabapentin (NEURONTIN) 300 MG capsule Take 1 capsule (300 mg total) by mouth 2 (two) times daily. 60 capsule 3 07/27/2018 at Unknown time  . glipiZIDE (GLUCOTROL) 5 MG tablet Take 5 mg by mouth daily before breakfast.    07/27/2018 at Unknown time  . Icosapent Ethyl 1 g CAPS Take 1 capsule by mouth daily.    07/27/2018 at Unknown time  . Insulin Glargine (LANTUS SOLOSTAR) 100 UNIT/ML Solostar Pen Inject 30 Units into  the skin at bedtime. 5 pen 2 07/26/2018 at Unknown time  . latanoprost (XALATAN) 0.005 % ophthalmic solution Place 1 drop into both eyes at bedtime.   07/26/2018 at Unknown time  . loratadine (CLARITIN) 10 MG tablet Take 10 mg daily by mouth.   07/27/2018 at Unknown time  . losartan (COZAAR) 100 MG tablet Take 100 mg by mouth daily.   07/27/2018 at Unknown time  . Multiple Vitamin (MULTIVITAMIN WITH MINERALS) TABS tablet Take 1 tablet by mouth daily.   07/27/2018 at Unknown time  . pantoprazole (PROTONIX) 40 MG tablet Take 40 mg daily by mouth.    07/27/2018 at Unknown time  . triamcinolone ointment (KENALOG) 0.1 % APPLY OINTMENT TOPICALLY TO AFFECTED AREA TWICE DAILY   Past Week at Unknown time  . Vitamin D, Ergocalciferol, (DRISDOL) 50000 UNITS CAPS capsule Take 50,000 Units by mouth every Saturday.   07/21/2018   Scheduled: . amLODipine  5 mg Per Tube Daily  . atorvastatin  40 mg Per Tube Daily  . [START ON 07/30/2018] carvedilol  25 mg Per Tube BID WC  . chlorhexidine gluconate (MEDLINE KIT)  15 mL Mouth Rinse BID  . chlorhexidine gluconate (MEDLINE KIT)  15 mL Mouth Rinse BID  . Chlorhexidine Gluconate Cloth  6 each Topical Q0600  . clopidogrel  75 mg Per Tube Daily  . feeding supplement (ENSURE ENLIVE)  237 mL Per Tube QID  . furosemide  60 mg Intravenous Q12H  . gabapentin  300 mg Per Tube Q12H  . heparin  5,000 Units Subcutaneous Q8H  . insulin aspart  0-9 Units Subcutaneous Q4H  . insulin glargine  10 Units Subcutaneous QHS  . ipratropium-albuterol  3 mL Inhalation Q6H  . latanoprost  1 drop Both Eyes QHS  . mouth rinse  15 mL Mouth Rinse 10 times per day  . metolazone  5 mg Oral Once  . multivitamin  15 mL Per Tube Daily  . pantoprazole sodium  40 mg Per Tube Daily  . senna  1 tablet Per Tube QHS  . sodium chloride flush  3 mL Intravenous Q12H  . [START ON 08/04/2018] Vitamin D (Ergocalciferol)  50,000 Units Per Tube Q Sat   Continuous: . sodium chloride Stopped (07/28/18 1252)  .  piperacillin-tazobactam (ZOSYN)  IV 3.375 g (07/29/18 0615)  . propofol (DIPRIVAN) infusion 30 mcg/kg/min (07/29/18 0612)   ZDG:UYQIHK chloride, acetaminophen **OR** acetaminophen, albuterol, hydrALAZINE, meclizine, midazolam, ondansetron **OR** ondansetron (ZOFRAN) IV, polyethylene glycol, sodium chloride flush, traZODone  Assesment: He was admitted with respiratory failure.  He was being treated for acute on chronic diastolic heart failure and he suffered cardiopulmonary arrest.  He was intubated and placed on mechanical ventilation.  At baseline he has obstructive sleep apnea noncompliant with CPAP.  I think he probably has aspiration pneumonia which is being treated.  He is on 60% oxygen.  He is apparently having improvement in his secretions per respiratory.  He is down about  2 L as far as the heart failure is concerned.  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  Start tube feedings.    LOS: 2 days   Alonza Bogus 07/29/2018, 7:00 AM

## 2018-07-29 NOTE — Progress Notes (Signed)
Secretions clearing not as much as earlier should be able to wean oxygen today.

## 2018-07-30 ENCOUNTER — Inpatient Hospital Stay (HOSPITAL_COMMUNITY): Payer: Medicare Other

## 2018-07-30 LAB — BLOOD GAS, ARTERIAL
Acid-Base Excess: 4.1 mmol/L — ABNORMAL HIGH (ref 0.0–2.0)
Bicarbonate: 28.3 mmol/L — ABNORMAL HIGH (ref 20.0–28.0)
FIO2: 35
O2 Saturation: 94.1 %
Patient temperature: 37
pCO2 arterial: 35.7 mmHg (ref 32.0–48.0)
pH, Arterial: 7.496 — ABNORMAL HIGH (ref 7.350–7.450)
pO2, Arterial: 70.8 mmHg — ABNORMAL LOW (ref 83.0–108.0)

## 2018-07-30 LAB — BASIC METABOLIC PANEL
Anion gap: 15 (ref 5–15)
BUN: 77 mg/dL — ABNORMAL HIGH (ref 8–23)
CO2: 24 mmol/L (ref 22–32)
Calcium: 8.7 mg/dL — ABNORMAL LOW (ref 8.9–10.3)
Chloride: 103 mmol/L (ref 98–111)
Creatinine, Ser: 2.95 mg/dL — ABNORMAL HIGH (ref 0.61–1.24)
GFR calc Af Amer: 23 mL/min — ABNORMAL LOW (ref >60)
GFR calc non Af Amer: 20 mL/min — ABNORMAL LOW (ref >60)
Glucose, Bld: 379 mg/dL — ABNORMAL HIGH (ref 70–99)
Potassium: 3.4 mmol/L — ABNORMAL LOW (ref 3.5–5.1)
Sodium: 142 mmol/L (ref 135–145)

## 2018-07-30 LAB — GLUCOSE, CAPILLARY
Glucose-Capillary: 321 mg/dL — ABNORMAL HIGH (ref 70–99)
Glucose-Capillary: 355 mg/dL — ABNORMAL HIGH (ref 70–99)
Glucose-Capillary: 361 mg/dL — ABNORMAL HIGH (ref 70–99)
Glucose-Capillary: 392 mg/dL — ABNORMAL HIGH (ref 70–99)
Glucose-Capillary: 345 mg/dL — ABNORMAL HIGH (ref 70–99)

## 2018-07-30 LAB — TRIGLYCERIDES: Triglycerides: 298 mg/dL — ABNORMAL HIGH (ref <150)

## 2018-07-30 LAB — PHOSPHORUS: Phosphorus: 5.6 mg/dL — ABNORMAL HIGH (ref 2.5–4.6)

## 2018-07-30 LAB — MAGNESIUM: Magnesium: 2.2 mg/dL (ref 1.7–2.4)

## 2018-07-30 MED ORDER — ORAL CARE MOUTH RINSE
15.0000 mL | Freq: Two times a day (BID) | OROMUCOSAL | Status: DC
  Administered 2018-07-30 – 2018-07-31 (×4): 15 mL via OROMUCOSAL

## 2018-07-30 MED ORDER — INSULIN GLARGINE 100 UNIT/ML ~~LOC~~ SOLN
16.0000 [IU] | Freq: Every day | SUBCUTANEOUS | Status: DC
  Administered 2018-07-30: 16 [IU] via SUBCUTANEOUS
  Filled 2018-07-30 (×2): qty 0.16

## 2018-07-30 MED ORDER — ATORVASTATIN CALCIUM 40 MG PO TABS
40.0000 mg | ORAL_TABLET | Freq: Every day | ORAL | Status: DC
  Administered 2018-07-31 – 2018-08-01 (×2): 40 mg via ORAL
  Filled 2018-07-30 (×2): qty 1

## 2018-07-30 MED ORDER — GUAIFENESIN ER 600 MG PO TB12
600.0000 mg | ORAL_TABLET | Freq: Two times a day (BID) | ORAL | Status: DC
  Administered 2018-07-30 – 2018-08-01 (×5): 600 mg via ORAL
  Filled 2018-07-30 (×5): qty 1

## 2018-07-30 MED ORDER — ONDANSETRON HCL 4 MG/2ML IJ SOLN: 4.0000 mg | Freq: Four times a day (QID) | INTRAMUSCULAR | Status: DC | PRN

## 2018-07-30 MED ORDER — FENTANYL CITRATE (PF) 100 MCG/2ML IJ SOLN: 25.0000 ug | INTRAMUSCULAR | Status: DC | PRN

## 2018-07-30 MED ORDER — CARVEDILOL 12.5 MG PO TABS
25.0000 mg | ORAL_TABLET | Freq: Two times a day (BID) | ORAL | Status: DC
  Administered 2018-07-30 – 2018-08-01 (×4): 25 mg via ORAL
  Filled 2018-07-30 (×4): qty 2

## 2018-07-30 MED ORDER — ONDANSETRON HCL 4 MG PO TABS: 4.0000 mg | ORAL_TABLET | Freq: Four times a day (QID) | ORAL | Status: DC | PRN

## 2018-07-30 MED ORDER — ACETAMINOPHEN 325 MG PO TABS: 650.0000 mg | ORAL_TABLET | Freq: Four times a day (QID) | ORAL | Status: DC | PRN

## 2018-07-30 MED ORDER — TRAZODONE HCL 50 MG PO TABS: 50.0000 mg | ORAL_TABLET | Freq: Every evening | ORAL | Status: DC | PRN

## 2018-07-30 MED ORDER — PRO-STAT SUGAR FREE PO LIQD
60.0000 mL | Freq: Two times a day (BID) | ORAL | Status: DC
  Administered 2018-07-30 – 2018-08-01 (×3): 60 mL via ORAL
  Filled 2018-07-30 (×4): qty 60

## 2018-07-30 MED ORDER — ACETAMINOPHEN 650 MG RE SUPP: 650.0000 mg | Freq: Four times a day (QID) | RECTAL | Status: DC | PRN

## 2018-07-30 MED ORDER — VITAMIN D (ERGOCALCIFEROL) 1.25 MG (50000 UNIT) PO CAPS: 50000.0000 [IU] | ORAL_CAPSULE | ORAL | Status: DC

## 2018-07-30 MED ORDER — AMOXICILLIN-POT CLAVULANATE 500-125 MG PO TABS
1.0000 | ORAL_TABLET | Freq: Two times a day (BID) | ORAL | Status: DC
  Administered 2018-07-30 – 2018-08-01 (×5): 500 mg via ORAL
  Filled 2018-07-30 (×5): qty 1

## 2018-07-30 MED ORDER — INSULIN ASPART 100 UNIT/ML ~~LOC~~ SOLN
0.0000 [IU] | Freq: Three times a day (TID) | SUBCUTANEOUS | Status: DC
  Administered 2018-07-30 – 2018-07-31 (×2): 15 [IU] via SUBCUTANEOUS
  Administered 2018-07-31: 11 [IU] via SUBCUTANEOUS
  Administered 2018-07-31: 15 [IU] via SUBCUTANEOUS
  Administered 2018-08-01: 8 [IU] via SUBCUTANEOUS

## 2018-07-30 MED ORDER — PRO-STAT SUGAR FREE PO LIQD: 60.0000 mL | Freq: Two times a day (BID) | ORAL | Status: DC

## 2018-07-30 MED ORDER — SENNA 8.6 MG PO TABS
1.0000 | ORAL_TABLET | Freq: Every day | ORAL | Status: DC
  Administered 2018-07-30 – 2018-07-31 (×2): 8.6 mg via ORAL
  Filled 2018-07-30 (×2): qty 1

## 2018-07-30 MED ORDER — AMLODIPINE BESYLATE 5 MG PO TABS: 5.0000 mg | ORAL_TABLET | Freq: Every day | ORAL | Status: DC

## 2018-07-30 MED ORDER — POLYETHYLENE GLYCOL 3350 17 G PO PACK: 17.0000 g | PACK | Freq: Every day | ORAL | Status: DC | PRN

## 2018-07-30 MED ORDER — CLOPIDOGREL BISULFATE 75 MG PO TABS
75.0000 mg | ORAL_TABLET | Freq: Every day | ORAL | Status: DC
  Administered 2018-07-31 – 2018-08-01 (×2): 75 mg via ORAL
  Filled 2018-07-30 (×2): qty 1

## 2018-07-30 MED ORDER — INSULIN ASPART 100 UNIT/ML ~~LOC~~ SOLN
0.0000 [IU] | Freq: Every day | SUBCUTANEOUS | Status: DC
  Administered 2018-07-30: 4 [IU] via SUBCUTANEOUS
  Administered 2018-07-31: 5 [IU] via SUBCUTANEOUS

## 2018-07-30 MED ORDER — PREDNISONE 20 MG PO TABS
50.0000 mg | ORAL_TABLET | Freq: Every day | ORAL | Status: DC
  Administered 2018-07-30 – 2018-07-31 (×2): 50 mg via ORAL
  Filled 2018-07-30 (×2): qty 1

## 2018-07-30 MED ORDER — GABAPENTIN 250 MG/5ML PO SOLN
300.0000 mg | Freq: Two times a day (BID) | ORAL | Status: DC
  Administered 2018-07-30: 300 mg via ORAL
  Filled 2018-07-30 (×4): qty 6

## 2018-07-30 NOTE — Evaluation (Signed)
Clinical/Bedside Swallow Evaluation Patient Details  Name: Carl Miller MRN: [id number] Date of Birth: 1945-09-03  Today's Date: 07/30/2018 Time: SLP Start Time (ACUTE ONLY): 1226 SLP Stop Time (ACUTE ONLY): 1255 SLP Time Calculation (min) (ACUTE ONLY): 29 min  Past Medical History:  Past Medical History:  Diagnosis Date  . Diabetes mellitus (HCC)   . 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 GERD (gastroesophageal reflux disease)   . Headache   . Hyperlipidemia   . Hypertension   . Palpitations   . Stroke (HCC)   . Vision abnormalities   . Worsening headaches 01/28/2014   Past Surgical History:  Past Surgical History:  Procedure Laterality Date  . BACK SURGERY    . DENTAL SURGERY    . LEFT HEART CATHETERIZATION WITH CORONARY ANGIOGRAM N/A 01/13/2014   Procedure: LEFT HEART CATHETERIZATION WITH CORONARY ANGIOGRAM;  Surgeon: Pamella PertJagadeesh R Ganji, MD;  [facility];  Service: Cardiovascular;  Laterality: N/A;  . lithotomy    . SHOULDER SURGERY     HPI:  73 year old with past medical history relevant for obesity/OSA, history of HFpEF, hypertension and diabetes admitted on 07/27/2018 with acute on chronic CHF exacerbation after presenting with hypoxia, significant dyspnea, 20 pound weight gain and significant lower extremity edema. Pt extubated this AM and consumed a full liquid lunch per RN without incident.   Assessment / Plan / Recommendation Clinical Impression  Clinincal swallow evaluation completed at bedside. Pt extubated this AM, but was started on a full liquid diet and RN reported that he was tolerating well. Oral motor examination is WNL. Pt with strong, but congested cough. He denies previous difficulty swallowing. Pt self presented thin liquids via straw sips, puree, and regular textures. Pt with occasional delayed, congested cough following dry graham crackers- difficult to determine if related to po intake as Pt  with congested, strong cough at baseline. Pt was encouraged to consume foods slowly and he indicates that he typically does this. Pt observed swallowing pills whole with water. Recommend D3/mech soft and thin liquids and SLP to follow during acute stay for upgrades as appropriate.   SLP Visit Diagnosis: Dysphagia, unspecified (R13.10)    Aspiration Risk  Mild aspiration risk    Diet Recommendation Dysphagia 3 (Mech soft);Thin liquid   Liquid Administration via: Straw;Cup Medication Administration: Whole meds with liquid Supervision: Patient able to self feed;Intermittent supervision to cue for compensatory strategies Compensations: Slow rate;Small sips/bites Postural Changes: Seated upright at 90 degrees;Remain upright for at least 30 minutes after po intake    Other  Recommendations Oral Care Recommendations: Oral care BID;Staff/trained caregiver to provide oral care Other Recommendations: Clarify dietary restrictions   Follow up Recommendations (pending)      Frequency and Duration min 2x/week  1 week       Prognosis Prognosis for Safe Diet Advancement: Good      Swallow Study   General Date of Onset: 07/27/18 HPI: 73 year old with past medical history relevant for obesity/OSA, history of HFpEF, hypertension and diabetes admitted on 07/27/2018 with acute on chronic CHF exacerbation after presenting with hypoxia, significant dyspnea, 20 pound weight gain and significant lower extremity edema. Pt extubated this AM and consumed a full liquid lunch per RN without incident. Type of Study: Bedside Swallow Evaluation Previous Swallow Assessment: None on record Diet Prior to this Study: (full liquids) Temperature Spikes Noted: No Respiratory Status: Nasal cannula History of Recent Intubation: Yes Length of Intubations (days): 2 days Date extubated: 07/30/18  Behavior/Cognition: Alert;Cooperative;Pleasant mood Oral Cavity Assessment: Within Functional Limits Oral Care Completed by  SLP: Recent completion by staff Oral Cavity - Dentition: (has some dentition; dentures at home) Vision: Functional for self-feeding Self-Feeding Abilities: Able to feed self Patient Positioning: Upright in bed Baseline Vocal Quality: Normal Volitional Cough: Congested;Strong Volitional Swallow: Able to elicit    Oral/Motor/Sensory Function Overall Oral Motor/Sensory Function: Within functional limits   Ice Chips Ice chips: Not tested   Thin Liquid Thin Liquid: Within functional limits Presentation: Straw;Self Fed    Nectar Thick Nectar Thick Liquid: Not tested   Honey Thick Honey Thick Liquid: Not tested   Puree Puree: Within functional limits Presentation: Spoon;Self Fed   Solid     Solid: Within functional limits Presentation: Self Fed Other Comments: delayed coughing, not necessarily related to po intake     Thank you,  Genene Churn, [place] 07/30/2018,12:55 PM

## 2018-07-30 NOTE — Progress Notes (Signed)
Inpatient Diabetes Program Recommendations  AACE/ADA: New Consensus Statement on Inpatient Glycemic Control   Target Ranges:  Prepandial:   less than 140 mg/dL      Peak postprandial:   less than 180 mg/dL (1-2 hours)      Critically ill patients:  140 - 180 mg/dL   Results for DAYMION, NAZAIRE (MRN [id number]) as of 07/30/2018 10:59  Ref. Range 07/29/2018 07:43 07/29/2018 11:35 07/29/2018 16:06 07/29/2018 16:26 07/29/2018 20:03 07/29/2018 23:54 07/30/2018 04:27 07/30/2018 07:38  Glucose-Capillary Latest Ref Range: 70 - 99 mg/dL 186 (H) 197 (H) 256 (H) 281 (H) 285 (H) 297 (H) 321 (H) 355 (H)   Review of Glycemic Control  Diabetes history: DM2 Outpatient Diabetes medications: Lantus 30 units QHS, Glipizide 5 mg QAM Current orders for Inpatient glycemic control: Lantus 10 units QHS, Novolog 0-9 units Q4H; Solumedrol 40 mg Q12H  Inpatient Diabetes Program Recommendations:   Insulin - Basal: Patient received Lantus 10 units last night at 22:16 and glucose has ranged from 297-355 mg/dl today. Noted patient extubated today. Please consider increasing Lantus to 20 units QHS.  Thanks, Barnie Alderman, RN, MSN, CDE Diabetes Coordinator Inpatient Diabetes Program [phone] (Team Pager from 8am to 5pm)

## 2018-07-30 NOTE — Progress Notes (Signed)
Subjective: Events of last night noted.  He remains intubated and on the ventilator.  He is having some secretions.  He is somewhat agitated at times.  Objective: Vital signs in last 24 hours: Temp:  [97.9 F (36.6 C)-99.3 F (37.4 C)] 97.9 F (36.6 C) (07/13 0748) Pulse Rate:  [49-80] 67 (07/13 0748) Resp:  [13-27] 20 (07/13 0748) BP: (126-173)/(61-119) 157/73 (07/13 0730) SpO2:  [93 %-98 %] 96 % (07/13 0748) FiO2 (%):  [35 %-45 %] 35 % (07/13 0400) Weight:  [104.7 kg] 104.7 kg (07/13 0500) Weight change: -3.6 kg Last BM Date: 07/29/18  Intake/Output from previous day: 07/12 0701 - 07/13 0700 In: 1395.6 [I.V.:470.9; NG/GT:774.7; IV Piggyback:150] Out: 2600 [Urine:2600]  PHYSICAL EXAM General appearance: Intubated sedated on the ventilator somewhat agitated at times Resp: rhonchi bilaterally Cardio: regular rate and rhythm, S1, S2 normal, no murmur, click, rub or gallop GI: soft, non-tender; bowel sounds normal; no masses,  no organomegaly Extremities: extremities normal, atraumatic, no cyanosis or edema  Lab Results:  Results for orders placed or performed during the hospital encounter of 07/27/18 (from the past 48 hour(s))  Glucose, capillary     Status: Abnormal   Collection Time: 07/28/18  8:00 AM  Result Value Ref Range   Glucose-Capillary 201 (H) 70 - 99 mg/dL  Culture, blood (Routine X 2) w Reflex to ID Panel     Status: None (Preliminary result)   Collection Time: 07/28/18  8:34 AM   Specimen: BLOOD LEFT HAND  Result Value Ref Range   Specimen Description BLOOD LEFT HAND Blood Culture adequate volume    Special Requests BOTTLES DRAWN AEROBIC AND ANAEROBIC    Culture      NO GROWTH < 24 HOURS Performed at [facility], [address], [place], [place] [address]    Report Status PENDING   Blood gas, arterial     Status: Abnormal   Collection Time: 07/28/18  8:55 AM  Result Value Ref Range   FIO2 100.00    pH, Arterial 7.251 (L) 7.350 - 7.450   pCO2 arterial  59.6 (H) 32.0 - 48.0 mmHg   pO2, Arterial 42.3 (L) 83.0 - 108.0 mmHg   Bicarbonate 22.4 20.0 - 28.0 mmol/L   Acid-base deficit 1.0 0.0 - 2.0 mmol/L   O2 Saturation 76.5 %   Patient temperature 35.0    Allens test (pass/fail) PASS PASS    Comment: Performed at [facility], [address], [place], [place] [address]  Blood gas, arterial     Status: Abnormal   Collection Time: 07/28/18 10:28 AM  Result Value Ref Range   FIO2 80.00    pH, Arterial 7.284 (L) 7.350 - 7.450   pCO2 arterial 53.3 (H) 32.0 - 48.0 mmHg   pO2, Arterial 88.2 83.0 - 108.0 mmHg   Bicarbonate 22.8 20.0 - 28.0 mmol/L   Acid-base deficit 1.3 0.0 - 2.0 mmol/L   O2 Saturation 96.6 %   Patient temperature 35.5    Allens test (pass/fail) PASS PASS    Comment: Performed at [facility], [address], [place], [place] [address]  Lactic acid, plasma     Status: None   Collection Time: 07/28/18 10:46 AM  Result Value Ref Range   Lactic Acid, Venous 0.9 0.5 - 1.9 mmol/L    Comment: Performed at [facility], [address], [place], [place] [address]  Glucose, capillary     Status: Abnormal   Collection Time: 07/28/18 11:17 AM  Result Value Ref Range  Glucose-Capillary 177 (H) 70 - 99 mg/dL  Troponin I (High Sensitivity)     Status: None   Collection Time: 07/28/18  1:06 PM  Result Value Ref Range   Troponin I (High Sensitivity) 15.00 <18 ng/L    Comment: (NOTE) Elevated high sensitivity troponin I (hsTnI) values and significant  changes across serial measurements may suggest ACS but many other  chronic and acute conditions are known to elevate hsTnI results.  Refer to the "Links" section for chest pain algorithms and additional  guidance. Performed at [facility], [address], [place], [place] [address]   Glucose, capillary     Status: Abnormal   Collection Time: 07/28/18  4:27 PM  Result Value Ref Range   Glucose-Capillary 170 (H) 70 - 99 mg/dL  Culture, blood (Routine X 2) w Reflex to ID Panel     Status:  None (Preliminary result)   Collection Time: 07/28/18  6:42 PM   Specimen: BLOOD LEFT ARM  Result Value Ref Range   Specimen Description BLOOD LEFT ARM    Special Requests      BOTTLES DRAWN AEROBIC ONLY Blood Culture adequate volume   Culture      NO GROWTH < 12 HOURS Performed at [facility], [address], [place], [place] [address]    Report Status PENDING   Glucose, capillary     Status: Abnormal   Collection Time: 07/28/18  8:03 PM  Result Value Ref Range   Glucose-Capillary 236 (H) 70 - 99 mg/dL  Glucose, capillary     Status: Abnormal   Collection Time: 07/29/18 12:33 AM  Result Value Ref Range   Glucose-Capillary 257 (H) 70 - 99 mg/dL  Culture, respiratory     Status: None (Preliminary result)   Collection Time: 07/29/18  1:23 AM   Specimen: SPU  Result Value Ref Range   Specimen Description      SPUTUM Performed at [facility], [address], [place], [place] [address]    Special Requests      NONE Performed at [facility], [address] [place], [place] [address]    Gram Stain      RARE WBC PRESENT, PREDOMINANTLY PMN RARE GRAM POSITIVE COCCI Performed at [facility], [place] [address], [place], [place] [address]    Culture PENDING    Report Status PENDING   Blood gas, arterial     Status: None   Collection Time: 07/29/18  2:30 AM  Result Value Ref Range   FIO2 60.00    pH, Arterial 7.443 7.350 - 7.450   pCO2 arterial 38.4 32.0 - 48.0 mmHg   pO2, Arterial 83.2 83.0 - 108.0 mmHg   Bicarbonate 26.3 20.0 - 28.0 mmol/L   Acid-Base Excess 2.0 0.0 - 2.0 mmol/L   O2 Saturation 95.9 %   Patient temperature 37.0    Allens test (pass/fail) PASS PASS    Comment: Performed at [facility], [address], [place], [place] [address]  Glucose, capillary     Status: Abnormal   Collection Time: 07/29/18  3:33 AM  Result Value Ref Range   Glucose-Capillary 209 (H) 70 - 99 mg/dL  Triglycerides     Status: Abnormal   Collection Time: 07/29/18  3:39 AM  Result  Value Ref Range   Triglycerides 402 (H) <150 mg/dL    Comment: Performed at [facility], [address] [place],  [address]  CBC     Status: Abnormal   Collection Time: 07/29/18  3:39 AM  Result Value Ref Range   WBC 10.4 4.0 - 10.5 K/uL   RBC 3.49 (L) 4.22 - 5.81 MIL/uL   Hemoglobin 9.7 (L) 13.0 - 17.0 g/dL   HCT 33.5 (L) 39.0 - 52.0 %   MCV 96.0 80.0 - 100.0 fL   MCH 27.8 26.0 - 34.0 pg   MCHC 29.0 (L) 30.0 - 36.0 g/dL   RDW 14.9 11.5 - 15.5 %   Platelets 191 150 - 400 K/uL   nRBC 0.0 0.0 - 0.2 %    Comment: Performed at [facility], [address] [place], [place] [address]  Comprehensive metabolic panel     Status: Abnormal   Collection Time: 07/29/18  3:39 AM  Result Value Ref Range   Sodium 144 135 - 145 mmol/L   Potassium 3.5 3.5 - 5.1 mmol/L    Comment: DELTA CHECK NOTED   Chloride 109 98 - 111 mmol/L   CO2 25 22 - 32 mmol/L   Glucose, Bld 258 (H) 70 - 99 mg/dL   BUN 71 (H) 8 - 23 mg/dL   Creatinine, Ser 3.02 (H) 0.61 - 1.24 mg/dL   Calcium 8.4 (L) 8.9 - 10.3 mg/dL   Total Protein 5.3 (L) 6.5 - 8.1 g/dL   Albumin 2.6 (L) 3.5 - 5.0 g/dL   AST 13 (L) 15 - 41 U/L   ALT 17 0 - 44 U/L   Alkaline Phosphatase 58 38 - 126 U/L   Total Bilirubin 0.4 0.3 - 1.2 mg/dL   GFR calc non Af Amer 19 (L) >60 mL/min   GFR calc Af Amer 22 (L) >60 mL/min   Anion gap 10 5 - 15    Comment: Performed at [facility], [address] [place], [place] [address]  Magnesium     Status: None   Collection Time: 07/29/18  7:06 AM  Result Value Ref Range   Magnesium 2.4 1.7 - 2.4 mg/dL    Comment: Performed at [facility], [address], [place], [place] [address]  Phosphorus     Status: None   Collection Time: 07/29/18  7:06 AM  Result Value Ref Range   Phosphorus 4.4 2.5 - 4.6 mg/dL    Comment: Performed at [facility], [address] [place], [place] [address]  Glucose, capillary     Status: Abnormal   Collection Time: 07/29/18  7:43 AM  Result Value Ref Range    Glucose-Capillary 186 (H) 70 - 99 mg/dL  Glucose, capillary     Status: Abnormal   Collection Time: 07/29/18 11:35 AM  Result Value Ref Range   Glucose-Capillary 197 (H) 70 - 99 mg/dL  Glucose, capillary     Status: Abnormal   Collection Time: 07/29/18  4:06 PM  Result Value Ref Range   Glucose-Capillary 256 (H) 70 - 99 mg/dL  Glucose, capillary     Status: Abnormal   Collection Time: 07/29/18  4:26 PM  Result Value Ref Range   Glucose-Capillary 281 (H) 70 - 99 mg/dL  Magnesium     Status: None   Collection Time: 07/29/18  4:31 PM  Result Value Ref Range   Magnesium 2.2 1.7 - 2.4 mg/dL    Comment: Performed at [facility], [address], [place], [place] [address]  Phosphorus     Status: None   Collection Time: 07/29/18  4:31 PM  Result Value Ref Range   Phosphorus 4.5 2.5 - 4.6 mg/dL    Comment: Performed at [facility], [address], [place], [place]  [address]  Glucose, capillary     Status: Abnormal   Collection Time: 07/29/18  8:03 PM  Result Value Ref Range   Glucose-Capillary 285 (H) 70 - 99 mg/dL   Comment 1 Notify RN    Comment 2 Document in Chart   Glucose, capillary     Status: Abnormal   Collection Time: 07/29/18 11:54 PM  Result Value Ref Range   Glucose-Capillary 297 (H) 70 - 99 mg/dL  Magnesium     Status: None   Collection Time: 07/30/18  4:16 AM  Result Value Ref Range   Magnesium 2.2 1.7 - 2.4 mg/dL    Comment: Performed at [facility], [address], [place], [place] [address]  Phosphorus     Status: Abnormal   Collection Time: 07/30/18  4:16 AM  Result Value Ref Range   Phosphorus 5.6 (H) 2.5 - 4.6 mg/dL    Comment: Performed at [facility], [address], [place], [place] [address]  Triglycerides     Status: Abnormal   Collection Time: 07/30/18  4:16 AM  Result Value Ref Range   Triglycerides 298 (H) <150 mg/dL    Comment: Performed at [facility], [address], [place], [place] [address]  Basic metabolic panel     Status: Abnormal    Collection Time: 07/30/18  4:16 AM  Result Value Ref Range   Sodium 142 135 - 145 mmol/L   Potassium 3.4 (L) 3.5 - 5.1 mmol/L   Chloride 103 98 - 111 mmol/L   CO2 24 22 - 32 mmol/L   Glucose, Bld 379 (H) 70 - 99 mg/dL   BUN 77 (H) 8 - 23 mg/dL   Creatinine, Ser 2.95 (H) 0.61 - 1.24 mg/dL   Calcium 8.7 (L) 8.9 - 10.3 mg/dL   GFR calc non Af Amer 20 (L) >60 mL/min   GFR calc Af Amer 23 (L) >60 mL/min   Anion gap 15 5 - 15    Comment: Performed at [facility], [address] [place], [place] [address]  Glucose, capillary     Status: Abnormal   Collection Time: 07/30/18  4:27 AM  Result Value Ref Range   Glucose-Capillary 321 (H) 70 - 99 mg/dL  Blood gas, arterial     Status: Abnormal   Collection Time: 07/30/18  5:00 AM  Result Value Ref Range   FIO2 35.00    pH, Arterial 7.496 (H) 7.350 - 7.450   pCO2 arterial 35.7 32.0 - 48.0 mmHg   pO2, Arterial 70.8 (L) 83.0 - 108.0 mmHg   Bicarbonate 28.3 (H) 20.0 - 28.0 mmol/L   Acid-Base Excess 4.1 (H) 0.0 - 2.0 mmol/L   O2 Saturation 94.1 %   Patient temperature 37.0    Allens test (pass/fail) PASS PASS    Comment: Performed at [facility], [address], [place], [place] [address]  Glucose, capillary     Status: Abnormal   Collection Time: 07/30/18  7:38 AM  Result Value Ref Range   Glucose-Capillary 355 (H) 70 - 99 mg/dL    ABGS Recent Labs    07/30/18 0500  PHART 7.496*  PO2ART 70.8*  HCO3 28.3*   CULTURES Recent Results (from the past 240 hour(s))  SARS Coronavirus 2 (CEPHEID - Performed in [facility] hospital lab), Hosp Order     Status: None   Collection Time: 07/27/18  8:29 AM   Specimen: Nasopharyngeal Swab  Result Value Ref Range Status   SARS Coronavirus 2 NEGATIVE NEGATIVE Final    Comment: (  NOTE) If result is NEGATIVE SARS-CoV-2 target nucleic acids are NOT DETECTED. The SARS-CoV-2 RNA is generally detectable in upper and lower  respiratory specimens during the acute phase of infection. The lowest   concentration of SARS-CoV-2 viral copies this assay can detect is 250  copies / mL. A negative result does not preclude SARS-CoV-2 infection  and should not be used as the sole basis for treatment or other  patient management decisions.  A negative result may occur with  improper specimen collection / handling, submission of specimen other  than nasopharyngeal swab, presence of viral mutation(s) within the  areas targeted by this assay, and inadequate number of viral copies  (<250 copies / mL). A negative result must be combined with clinical  observations, patient history, and epidemiological information. If result is POSITIVE SARS-CoV-2 target nucleic acids are DETECTED. The SARS-CoV-2 RNA is generally detectable in upper and lower  respiratory specimens dur ing the acute phase of infection.  Positive  results are indicative of active infection with SARS-CoV-2.  Clinical  correlation with patient history and other diagnostic information is  necessary to determine patient infection status.  Positive results do  not rule out bacterial infection or co-infection with other viruses. If result is PRESUMPTIVE POSTIVE SARS-CoV-2 nucleic acids MAY BE PRESENT.   A presumptive positive result was obtained on the submitted specimen  and confirmed on repeat testing.  While 2019 novel coronavirus  (SARS-CoV-2) nucleic acids may be present in the submitted sample  additional confirmatory testing may be necessary for epidemiological  and / or clinical management purposes  to differentiate between  SARS-CoV-2 and other Sarbecovirus currently known to infect humans.  If clinically indicated additional testing with an alternate test  methodology [phone]) is advised. The SARS-CoV-2 RNA is generally  detectable in upper and lower respiratory sp ecimens during the acute  phase of infection. The expected result is Negative. Fact Sheet for Patients:  StrictlyIdeas.no Fact Sheet  for Healthcare Providers: BankingDealers.co.za This test is not yet approved or cleared by the [place] FDA and has been authorized for detection and/or diagnosis of SARS-CoV-2 by FDA under an Emergency Use Authorization (EUA).  This EUA will remain in effect (meaning this test can be used) for the duration of the COVID-19 declaration under Section 564(b)(1) of the Act, 21 U.S.C. section 360bbb-3(b)(1), unless the authorization is terminated or revoked sooner. Performed at [facility], [address], [place], [place] [address]   MRSA PCR Screening     Status: None   Collection Time: 07/28/18  6:54 AM   Specimen: Nasal Mucosa; Nasopharyngeal  Result Value Ref Range Status   MRSA by PCR NEGATIVE NEGATIVE Final    Comment:        The GeneXpert MRSA Assay (FDA approved for NASAL specimens only), is one component of a comprehensive MRSA colonization surveillance program. It is not intended to diagnose MRSA infection nor to guide or monitor treatment for MRSA infections. Performed at [facility], [address] [place], [place] [address]   Culture, blood (Routine X 2) w Reflex to ID Panel     Status: None (Preliminary result)   Collection Time: 07/28/18  8:34 AM   Specimen: BLOOD LEFT HAND  Result Value Ref Range Status   Specimen Description BLOOD LEFT HAND Blood Culture adequate volume  Final   Special Requests BOTTLES DRAWN AEROBIC AND ANAEROBIC  Final   Culture   Final    NO GROWTH < 24 HOURS Performed at [facility],  [address] [place], [place] [address]    Report Status PENDING  Incomplete  Culture, blood (Routine X 2) w Reflex to ID Panel     Status: None (Preliminary result)   Collection Time: 07/28/18  6:42 PM   Specimen: BLOOD LEFT ARM  Result Value Ref Range Status   Specimen Description BLOOD LEFT ARM  Final   Special Requests   Final    BOTTLES DRAWN AEROBIC ONLY Blood Culture adequate volume   Culture   Final    NO GROWTH < 12  HOURS Performed at [facility], [address] [place], [place], [place] [address]    Report Status PENDING  Incomplete  Culture, respiratory     Status: None (Preliminary result)   Collection Time: 07/29/18  1:23 AM   Specimen: SPU  Result Value Ref Range Status   Specimen Description   Final    SPUTUM Performed at [facility], [address], [place], [place] [address]    Special Requests   Final    NONE Performed at [facility], [address], [place], [place] [address]    Gram Stain   Final    RARE WBC PRESENT, PREDOMINANTLY PMN RARE GRAM POSITIVE COCCI Performed at [facility], [place] [address] [place], [place] [address]    Culture PENDING  Incomplete   Report Status PENDING  Incomplete   Studies/Results: Dg Chest Port 1 View  Result Date: 07/29/2018 CLINICAL DATA:  Respiratory failure. EXAM: PORTABLE CHEST 1 VIEW COMPARISON:  07/28/2018 FINDINGS: Lung volumes remain low. There is lung base opacity that is similar to the prior study, consistent with bilateral effusions with either atelectasis or infection. Endotracheal tube and nasal/orogastric tube are stable in well positioned. IMPRESSION: 1. No significant change from the most recent prior study allowing for differences in lung volume and patient positioning. 2. Bilateral pleural effusions with persistent lung base opacities, the latter finding which may reflect atelectasis, pneumonia or a combination. 3. Stable support apparatus. Electronically Signed   By: Lajean Manes M.D.   On: 07/29/2018 08:43   Dg Chest Port 1 View  Result Date: 07/28/2018 CLINICAL DATA:  Dyspnea. Respiratory abnormalities. Placement of endotracheal tube and nasogastric tube. EXAM: PORTABLE CHEST 1 VIEW COMPARISON:  07/27/2018 FINDINGS: Endotracheal tube is 2.6 cm above the carina. Nasogastric tube extends into the abdomen but the tip is beyond the image. Cardiac pad overlying the left chest. Increased densities at the left lung base suggestive for  consolidation and/or pleural fluid. Increased hazy densities at the right lung base. Negative for pneumothorax. Heart size is upper limits of normal and stable. IMPRESSION: 1. Endotracheal tube is appropriately positioned. Nasogastric tube extends into the abdomen. 2. Increased basilar chest densities bilaterally. Findings could represent a combination of consolidation and pleural fluid. Electronically Signed   By: Markus Daft M.D.   On: 07/28/2018 08:44   Dg Abd Portable 1v  Result Date: 07/29/2018 CLINICAL DATA:  73 year old male nasogastric tube placement. EXAM: PORTABLE ABDOMEN - 1 VIEW COMPARISON:  KUB 05/07/2013. FINDINGS: Portable AP supine view at 2248 hours enteric tube tip is at the level of the distal body. Side hole is at the level of the midbody. Negative visible bowel gas pattern. Chronic degenerative and postoperative changes in the lumbar spine. Extensive chronic iliac artery calcified atherosclerosis. IMPRESSION: Enteric tube side hole at the gastric body. Electronically Signed   By: Genevie Ann M.D.   On: 07/29/2018 23:12    Medications:  Prior to Admission:  Medications  Prior to Admission  Medication Sig Dispense Refill Last Dose  . albuterol (PROVENTIL HFA;VENTOLIN HFA) 108 (90 Base) MCG/ACT inhaler Inhale into the lungs.   07/27/2018 at Unknown time  . alendronate (FOSAMAX) 70 MG tablet Take 70 mg by mouth every Saturday. Take with a full glass of water on an empty stomach.   07/21/2018  . amLODipine (NORVASC) 5 MG tablet Take 5 mg by mouth daily.   07/27/2018 at Unknown time  . atorvastatin (LIPITOR) 40 MG tablet Take 40 mg by mouth daily.   07/27/2018 at Unknown time  . carvedilol (COREG) 25 MG tablet Take 25 mg by mouth 2 (two) times daily with a meal.   07/27/2018 at 0700  . chlorthalidone (HYGROTON) 25 MG tablet Take 25 mg by mouth daily.   07/27/2018 at Unknown time  . clopidogrel (PLAVIX) 75 MG tablet Take 1 tablet (75 mg total) by mouth daily. 90 tablet 3 07/27/2018 at 0700 time  .  gabapentin (NEURONTIN) 300 MG capsule Take 1 capsule (300 mg total) by mouth 2 (two) times daily. 60 capsule 3 07/27/2018 at Unknown time  . glipiZIDE (GLUCOTROL) 5 MG tablet Take 5 mg by mouth daily before breakfast.    07/27/2018 at Unknown time  . Icosapent Ethyl 1 g CAPS Take 1 capsule by mouth daily.    07/27/2018 at Unknown time  . Insulin Glargine (LANTUS SOLOSTAR) 100 UNIT/ML Solostar Pen Inject 30 Units into the skin at bedtime. 5 pen 2 07/26/2018 at Unknown time  . latanoprost (XALATAN) 0.005 % ophthalmic solution Place 1 drop into both eyes at bedtime.   07/26/2018 at Unknown time  . loratadine (CLARITIN) 10 MG tablet Take 10 mg daily by mouth.   07/27/2018 at Unknown time  . losartan (COZAAR) 100 MG tablet Take 100 mg by mouth daily.   07/27/2018 at Unknown time  . Multiple Vitamin (MULTIVITAMIN WITH MINERALS) TABS tablet Take 1 tablet by mouth daily.   07/27/2018 at Unknown time  . pantoprazole (PROTONIX) 40 MG tablet Take 40 mg daily by mouth.    07/27/2018 at Unknown time  . triamcinolone ointment (KENALOG) 0.1 % APPLY OINTMENT TOPICALLY TO AFFECTED AREA TWICE DAILY   Past Week at Unknown time  . Vitamin D, Ergocalciferol, (DRISDOL) 50000 UNITS CAPS capsule Take 50,000 Units by mouth every Saturday.   07/21/2018   Scheduled: . amLODipine  5 mg Per Tube Daily  . atorvastatin  40 mg Per Tube Daily  . carvedilol  25 mg Per Tube BID WC  . chlorhexidine gluconate (MEDLINE KIT)  15 mL Mouth Rinse BID  . Chlorhexidine Gluconate Cloth  6 each Topical Q0600  . clopidogrel  75 mg Per Tube Daily  . feeding supplement (PRO-STAT SUGAR FREE 64)  30 mL Per Tube BID  . feeding supplement (VITAL HIGH PROTEIN)  1,000 mL Per Tube Q24H  . furosemide  60 mg Intravenous Q12H  . gabapentin  300 mg Per Tube Q12H  . heparin  5,000 Units Subcutaneous Q8H  . insulin aspart  0-9 Units Subcutaneous Q4H  . insulin glargine  10 Units Subcutaneous QHS  . ipratropium-albuterol  3 mL Inhalation Q6H  . latanoprost  1 drop  Both Eyes QHS  . mouth rinse  15 mL Mouth Rinse 10 times per day  . methylPREDNISolone (SOLU-MEDROL) injection  40 mg Intravenous Q12H  . metolazone  5 mg Oral Once  . multivitamin  15 mL Per Tube Daily  . pantoprazole sodium  40 mg Per Tube Daily  .  senna  1 tablet Per Tube QHS  . sodium chloride flush  3 mL Intravenous Q12H  . [START ON 08/04/2018] Vitamin D (Ergocalciferol)  50,000 Units Per Tube Q Sat   Continuous: . sodium chloride Stopped (07/28/18 1252)  . piperacillin-tazobactam (ZOSYN)  IV 3.375 g (07/30/18 6948)  . propofol (DIPRIVAN) infusion 50 mcg/kg/min (07/30/18 0436)   NIO:EVOJJK chloride, acetaminophen **OR** acetaminophen, albuterol, fentaNYL (SUBLIMAZE) injection, fentaNYL (SUBLIMAZE) injection, hydrALAZINE, meclizine, midazolam, ondansetron **OR** ondansetron (ZOFRAN) IV, polyethylene glycol, sodium chloride flush, traZODone  Assesment: He was admitted to the hospital with acute on chronic diastolic heart failure.  He is known to have obstructive sleep apnea.  He was being diuresed but was found unresponsive and with agonal respirations.  He was intubated and placed on mechanical ventilation and has remained on the ventilator since.  He had acute hypoxic respiratory failure.  He has sleep apnea and has been noncompliant with CPAP  He has renal failure which is a new diagnosis  Although he has thick secretions we may be able to wean some today.  I do not know if he will be able to be extubated. 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

## 2018-07-30 NOTE — Procedures (Signed)
Extubation Procedure Note Patient able to follow commands and perform -22 NIF and 1.10L VC. Mouth and airway suctioned before extubation. He was successfully extubated at 1045 and placed on a 4L HFNC. Patient Details:   Name: Carl Miller DOB: [birth year] MRN: [id number]   Airway Documentation:    Vent end date: 07/30/18 Vent end time: 1045   Evaluation  O2 sats: stable throughout Complications: No apparent complications Patient did tolerate procedure well. Bilateral Breath Sounds: Diminished   Yes  Pete Pelt 07/30/2018, 2:27 PM

## 2018-07-30 NOTE — Progress Notes (Signed)
He still has some yellow secretions thick, He did have his OGT come high.  The subglottic picked up little and none was suctioned up from ET.He is having some blood tinge from mouth picked up by subglotic. His oxygen is down to 35 and he can probably wean some on rate.. Versed seems to sedate him better than Diprivan. He still wakes up with tremors at times. Suspect he will be confused when trying to wean.

## 2018-07-30 NOTE — Care Management Important Message (Signed)
Important Message  Patient Details  Name: Carl Miller MRN: [id number] Date of Birth: [birth year]   Medicare Important Message Given:  Yes     Tommy Medal 07/30/2018, 2:30 PM

## 2018-07-30 NOTE — Progress Notes (Signed)
The patient was able to follow commands and perform a -22 NIF and 1.10L  VC.  He appears in no respiratory distress and all vitals are WNL. Patient will be extubated  per Dr. Denton Brick verbal orders.

## 2018-07-30 NOTE — Progress Notes (Signed)
Late entry- Xray showed OGT to be in proper place. Now at [facility]. TFs resumed. Will continue to monitor pt

## 2018-07-30 NOTE — Progress Notes (Signed)
Initial Nutrition Assessment  DOCUMENTATION CODES:   Obesity unspecified  INTERVENTION:   Vital High Protein @ 40 ml/hr and 60 ml Prostat via OGT tube BID.    Tube feeding regimen currently providing  1360 kcal,  144 grams protein, and 803 ml H2O.    Propofol-provides 436 kcal lipids q 24 hr.   NUTRITION DIAGNOSIS:   Inadequate oral intake related to inability to eat as evidenced by NPO status.   GOAL:   Provide needs based on ASPEN/SCCM guidelines  MONITOR:     REASON FOR ASSESSMENT:   Consult Enteral/tube feeding initiation and management  ASSESSMENT: Patient is an obese 73 yo male with acute on chronic CHF/ acute respiratory failure with hypoxia, DM-2 (A1C-7.6%) and acute kidney injury.   Patient is currently intubated on 7/11 ventilator support. Refused CPAP- unable to tolerate.  MV: 9.9  L/min Temp (24hrs), Avg:98.9 F (37.2 C), Min:97.9 F (36.6 C), Max:99.3 F (37.4 C)  Propofol: 16.5 ml/hr -provides 436 kcal lipids q 24 hr.     Medications reviewed and include: lipitor, Lasix (60 mg q 12 hr, Insulin, lantus, prednisone, senokot, MVI , Vitamin D (50,000 units) (start 7/18)  Labs: Potassium 3.4 (L), Glu 379 (H), BUN 77 (H), Cr 2.95 (H). Triglycerides 298 (H). BMP Latest Ref Rng & Units 07/30/2018 07/29/2018 07/28/2018  Glucose 70 - 99 mg/dL 379(H) 258(H) 238(H)  BUN 8 - 23 mg/dL 77(H) 71(H) 63(H)  Creatinine 0.61 - 1.24 mg/dL 2.95(H) 3.02(H) 3.31(H)  Sodium 135 - 145 mmol/L 142 144 143  Potassium 3.5 - 5.1 mmol/L 3.4(L) 3.5 5.5(H)  Chloride 98 - 111 mmol/L 103 109 110  CO2 22 - 32 mmol/L 24 25 25   Calcium 8.9 - 10.3 mg/dL 8.7(L) 8.4(L) 8.5(L)     NUTRITION - FOCUSED PHYSICAL EXAM:  Unable to complete Nutrition-Focused physical exam at this time.    Diet Order:   Diet Order            Diet NPO time specified  Diet effective now              EDUCATION NEEDS:   No education needs have been identified at this time Skin:  Skin Assessment:  Reviewed RN Assessment  Last BM:  7/12  Height:   Ht Readings from Last 1 Encounters:  07/30/18 6' (1.829 m)    Weight:   Wt Readings from Last 1 Encounters:  07/30/18 104.7 kg    Ideal Body Weight:  80.9 kg  BMI:  Body mass index is 31.31 kg/m.  Estimated Nutritional Needs:   Kcal:  2004  Protein:  162  Fluid:  per MD goals   Colman Cater MS,RD,CSG,LDN Office: [phone] Pager: [phone]

## 2018-07-30 NOTE — Progress Notes (Signed)
Patient Demographics:    Carl Miller, is a 73 y.o. male, DOB - 01-Feb-1945, ZOX:[id number]  Admit date - 07/27/2018   Admitting Physician Rosaleah Person Mariea Clonts, MD  Outpatient Primary MD for the patient is Barbie Banner, MD  LOS - 3   Chief Complaint  Patient presents with  . 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 --successfully extubated on 07/30/2018, son at bedside, --Tolerating full liquid diet, speech evaluation appreciated   A/p 1)Acute hypoxic and hypercapnic respiratory failure due to combination of CHF exacerbation and OSA with noncompliance with CPAP and Pneumonia--- extubated 07/30/2018, pulmonary consult from Dr. Juanetta Gosling appreciated, troponin and lactic acid not elevated   2)CAP with possible aspiration event--- patient is now afebrile WBC is down to 10.4 from 12.6, lactic acid is not elevated, pulmonologist consult from Dr. Juanetta Gosling appreciated, treated with IV Zosyn started on 07/28/2018 for possible aspiration pneumonia,, switch to p.o. Augmentin on 07/30/2018 blood cultures NGTD, ,  continue bronchodilators ---  Repeat chest x-ray with CHF and possible pneumonia  3)HFpEF--- acute on chronic preserved EF CHF exacerbation with hypoxia, echo with EF of 60 to 65% on 07/27/2018, troponins are flat,...  Continue diuresis with IV Lasix as ordered, daily weight and fluid input and output monitoring,--weight is down to 238 pounds from 245 pounds couple days ago -Pulmonary venous congestion on lower extremity edema appears to be improving  4)AKi--- last available creatinine is from 12/05/2016 it was 1.0, creatinine on admission was 2.6,...  Creatinine is around 3 , continue to monitor closely ... unable to establish if patient has underlying CKD given lack of recent lab data.... - Continue to hold Hold losartan and chlorthalidone, hold Aldactone,   5)obesity/OSA----PTA and during hospitalization patient was not compliant with CPAP --- compliance with CPAP encouraged  6)HTN---  stable, restart Coreg 25 mg p.o. twice daily, watch for bradycardia,  c/n amlodipine ,  may use IV Hydralazine 10 mg  Every 4 hours Prn for systolic blood pressure over 160 mmhg  6)DM2--A1c 7.6, reflecting fair diabetic control, stopped glipizide,  low-dose Lantus,.-- anticipate worsening hyperglycemia with steroids ---use Novolog/Humalog Sliding scale insulin with Accu-Cheks/Fingersticks as ordered   7)Social/Ethics--plan of care discussed with patient's son, daughter and grandson  , he remains a full code with no limitations to treatment at this time  Code Status : Full code  Family Communication:   Son, daughter  and grandson at bedside on 07/28/18, Discussed with daughter on 07/30/18 [phone])  Disposition Plan  : SNF  Procedure- Intubated 07/28/2018 Extubated 07/30/2018  Consults  :  Pulm  DVT Prophylaxis  :   - Heparin - SCDs   Lab Results  Component Value Date   PLT 191 07/29/2018    Inpatient Medications  Scheduled Meds: . [START ON 07/31/2018] amLODipine  5 mg Oral Daily  . amoxicillin-clavulanate  1 tablet Oral  Q12H  . [START ON 07/31/2018] atorvastatin  40 mg Oral Daily  . carvedilol  25 mg Oral BID WC  . Chlorhexidine Gluconate Cloth  6 each Topical Q0600  . [START ON 07/31/2018] clopidogrel  75 mg Oral Daily  . feeding supplement (PRO-STAT SUGAR FREE 64)  60 mL Oral BID  . furosemide  60 mg Intravenous Q12H  . gabapentin  300 mg Oral Q12H  . guaiFENesin  600 mg Oral BID  . heparin  5,000 Units Subcutaneous Q8H  . insulin aspart  0-15 Units Subcutaneous TID WC  . insulin aspart  0-5 Units Subcutaneous QHS  . insulin glargine  16 Units Subcutaneous QHS  . ipratropium-albuterol  3 mL Inhalation Q6H  . latanoprost  1 drop Both Eyes QHS  . mouth rinse  15 mL Mouth Rinse BID  . metolazone  5 mg Oral Once  . predniSONE  50 mg Oral Q breakfast  . senna  1 tablet Oral QHS  . sodium chloride flush  3 mL Intravenous Q12H  . [START ON 08/04/2018] Vitamin D (Ergocalciferol)  50,000 Units Oral Q Sat   Continuous Infusions: . sodium chloride Stopped (07/28/18 1252)   PRN Meds:.sodium chloride, acetaminophen **OR** acetaminophen, albuterol, hydrALAZINE, meclizine, ondansetron **OR** ondansetron (ZOFRAN) IV, polyethylene glycol, sodium chloride flush, traZODone   Anti-infectives (From admission, onward)   Start     Dose/Rate Route Frequency Ordered Stop   07/30/18 1245  amoxicillin-clavulanate (AUGMENTIN) 500-125 MG per tablet 500 mg     1 tablet Oral Every 12 hours 07/30/18 1231     07/28/18 1145  piperacillin-tazobactam (ZOSYN) IVPB 3.375 g  Status:  Discontinued     3.375 g 12.5 mL/hr over 240 Minutes Intravenous Every 8 hours 07/28/18 1143 07/30/18 1231        Objective:   Vitals:   07/30/18 1705 07/30/18 1730 07/30/18 1800 07/30/18 1806  BP:  127/62 (!) 133/57   Pulse:  80 76   Resp:  16 (!) 24 19  Temp:  99 F (37.2 C) 99 F (37.2 C)   TempSrc:      SpO2: 92% 94% 97%   Weight:      Height:        Wt Readings from Last 3 Encounters:  07/30/18 104.7 kg  08/14/17 104.3 kg   05/15/17 103.7 kg    Intake/Output Summary (Last 24 hours) at 07/30/2018 1921 Last data filed at 07/30/2018 1856 Gross per 24 hour  Intake 1196.1 ml  Output 2200 ml  Net -1003.9 ml    Physical Exam  successfully extubated on 07/30/2018, son at bedside, --Tolerating full liquid diet, speech evaluation appreciated   Gen:- Awake Alert, talking HEENT:- .AT, No sclera icterus Neck-Supple Neck,No JVD,.  Ears- very HOH Lungs-diminished in the bases, scattered rhonchi  CV- S1, S2 normal Abd-  +ve B.Sounds, Abd Soft, No tenderness,    Extremity/Skin:-1 +  edema,   , good pedal pulses Psych-affect is appropriate, oriented x3 Neuro-no new focal deficits, no tremors    Data Review:   Micro Results Recent Results (from the past 240 hour(s))  SARS Coronavirus 2 (CEPHEID - Performed in [facility] hospital lab), Hosp Order     Status: None   Collection Time: 07/27/18  8:29 AM   Specimen: Nasopharyngeal Swab  Result Value Ref Range Status   SARS Coronavirus 2 NEGATIVE NEGATIVE Final    Comment: (NOTE) If result is NEGATIVE SARS-CoV-2 target nucleic acids are NOT DETECTED. The SARS-CoV-2 RNA is generally detectable in upper and lower  respiratory specimens during the acute phase of infection. The lowest  concentration of SARS-CoV-2 viral copies this assay can detect is 250  copies / mL. A negative result does not preclude SARS-CoV-2 infection  and should not be used as the sole basis for treatment or other  patient management decisions.  A negative result may occur with  improper specimen collection / handling, submission of specimen other  than nasopharyngeal swab, presence of viral mutation(s) within the  areas targeted by this assay, and inadequate number of viral copies  (<250 copies / mL). A negative result must be combined with clinical  observations, patient history, and epidemiological information. If result is POSITIVE SARS-CoV-2 target nucleic acids are DETECTED. The  SARS-CoV-2 RNA is generally detectable in upper and lower  respiratory specimens dur ing the acute phase of infection.  Positive  results are indicative of active infection with SARS-CoV-2.  Clinical  correlation with patient history and other diagnostic information is  necessary to determine patient infection status.  Positive results do  not rule out bacterial infection or co-infection with other viruses. If result is PRESUMPTIVE POSTIVE SARS-CoV-2 nucleic acids MAY BE PRESENT.   A presumptive positive result was obtained on the submitted specimen  and confirmed on repeat testing.  While 2019 novel coronavirus  (SARS-CoV-2) nucleic acids may be present in the submitted sample  additional confirmatory testing may be necessary for epidemiological  and / or clinical management purposes  to differentiate between  SARS-CoV-2 and other Sarbecovirus currently known to infect humans.  If clinically indicated additional testing with an alternate test  methodology [phone]([id number]) is advised. The SARS-CoV-2 RNA is generally  detectable in upper and lower respiratory sp ecimens during the acute  phase of infection. The expected result is Negative. Fact Sheet for Patients:  BoilerBrush.com.cyhttps://www.fda.gov/media/136312/download Fact Sheet for Healthcare Providers: https://pope.com/https://www.fda.gov/media/136313/download This test is not yet approved or cleared by the [place] FDA and has been authorized for detection and/or diagnosis of SARS-CoV-2 by FDA under an Emergency Use Authorization (EUA).  This EUA will remain in effect (meaning this test can be used) for the duration of the COVID-19 declaration under Section 564(b)(1) of the Act, 21 U.S.C. section 360bbb-3(b)(1), unless the authorization is terminated or revoked sooner. Performed at [facility], [address] [place], [place] [address]   MRSA PCR Screening     Status: None   Collection Time: 07/28/18  6:54 AM   Specimen: Nasal Mucosa; Nasopharyngeal  Result  Value Ref Range Status   MRSA by PCR NEGATIVE NEGATIVE Final    Comment:        The GeneXpert MRSA Assay (FDA approved for NASAL specimens only), is one component of a comprehensive MRSA colonization surveillance program. It is not intended to diagnose MRSA infection nor to guide or monitor treatment for MRSA infections. Performed at [facility], [address] [place], [place] [address]   Culture, blood (Routine X 2) w Reflex to ID Panel     Status: None (Preliminary result)   Collection Time: 07/28/18  8:34 AM   Specimen: BLOOD LEFT HAND  Result Value Ref Range Status  Specimen Description BLOOD LEFT HAND Blood Culture adequate volume  Final   Special Requests BOTTLES DRAWN AEROBIC AND ANAEROBIC  Final   Culture   Final    NO GROWTH < 24 HOURS Performed at [facility], [address] [place], [place] [address]    Report Status PENDING  Incomplete  Culture, blood (Routine X 2) w Reflex to ID Panel     Status: None (Preliminary result)   Collection Time: 07/28/18  6:42 PM   Specimen: BLOOD LEFT ARM  Result Value Ref Range Status   Specimen Description BLOOD LEFT ARM  Final   Special Requests   Final    BOTTLES DRAWN AEROBIC ONLY Blood Culture adequate volume   Culture   Final    NO GROWTH < 12 HOURS Performed at [facility], [address], [place], [place] [address]    Report Status PENDING  Incomplete  Culture, respiratory     Status: None (Preliminary result)   Collection Time: 07/29/18  1:23 AM   Specimen: SPU  Result Value Ref Range Status   Specimen Description   Final    SPUTUM Performed at [facility], [address], [place], [place] [address]    Special Requests   Final    NONE Performed at [facility], [address] [place], [place] [address]    Gram Stain   Final    RARE WBC PRESENT, PREDOMINANTLY PMN RARE GRAM POSITIVE COCCI    Culture   Final    CULTURE REINCUBATED FOR BETTER GROWTH Performed at [facility], [address]  [place], [place] [address]    Report Status PENDING  Incomplete    Radiology Reports Dg Chest Port 1 View  Result Date: 07/30/2018 CLINICAL DATA:  Followup exam. Intubated patient. Respiratory failure. EXAM: PORTABLE CHEST 1 VIEW COMPARISON:  07/29/2018 and earlier studies. FINDINGS: Endotracheal tube and nasogastric tube are stable and well positioned. Bilateral vascular congestion with lower lung zone opacity consistent with a combination of pleural effusions and atelectasis. Findings stable from the most recent prior exam. Lung volumes remain low. IMPRESSION: 1. No significant change from the previous day's study. 2. Small bilateral pleural effusions with associated lung base opacity consistent with atelectasis and/or pneumonia. Central vascular congestion. 3. Stable well-positioned support apparatus. Electronically Signed   By: Amie Portland M.D.   On: 07/30/2018 08:53   Dg Chest Port 1 View  Result Date: 07/29/2018 CLINICAL DATA:  Respiratory failure. EXAM: PORTABLE CHEST 1 VIEW COMPARISON:  07/28/2018 FINDINGS: Lung volumes remain low. There is lung base opacity that is similar to the prior study, consistent with bilateral effusions with either atelectasis or infection. Endotracheal tube and nasal/orogastric tube are stable in well positioned. IMPRESSION: 1. No significant change from the most recent prior study allowing for differences in lung volume and patient positioning. 2. Bilateral pleural effusions with persistent lung base opacities, the latter finding which may reflect atelectasis, pneumonia or a combination. 3. Stable support apparatus. Electronically Signed   By: Amie Portland M.D.   On: 07/29/2018 08:43   Dg Chest Port 1 View  Result Date: 07/28/2018 CLINICAL DATA:  Dyspnea. Respiratory abnormalities. Placement of endotracheal tube and nasogastric tube. EXAM: PORTABLE CHEST 1 VIEW COMPARISON:  07/27/2018 FINDINGS: Endotracheal tube is 2.6 cm above the carina. Nasogastric tube extends  into the abdomen but the tip is beyond the image. Cardiac pad overlying the left chest. Increased densities at the left lung base suggestive for consolidation and/or pleural fluid. Increased hazy  densities at the right lung base. Negative for pneumothorax. Heart size is upper limits of normal and stable. IMPRESSION: 1. Endotracheal tube is appropriately positioned. Nasogastric tube extends into the abdomen. 2. Increased basilar chest densities bilaterally. Findings could represent a combination of consolidation and pleural fluid. Electronically Signed   By: Adam  Henn M.D.   On: 07/28/2018 08:44 Richarda Overlie  Dg Chest Port 1 View  Result Date: 07/27/2018 CLINICAL DATA:  Shortness of breath and decreased oxygen saturation EXAM: PORTABLE CHEST 1 VIEW COMPARISON:  None. FINDINGS: There is cardiomegaly with pulmonary venous hypertension. There is interstitial edema with small pleural effusions. There is no frank airspace consolidation. No adenopathy. There is aortic atherosclerosis. No bone lesions. IMPRESSION: Pulmonary vascular congestion with small pleural effusions and interstitial edema. Suspect a degree of congestive heart failure. No consolidation. Aortic Atherosclerosis (ICD10-I70.0). Electronically Signed   By: Bretta BangWilliam  Woodruff III M.D.   On: 07/27/2018 08:47   Dg Abd Portable 1v  Result Date: 07/29/2018 CLINICAL DATA:  73 year old male nasogastric tube placement. EXAM: PORTABLE ABDOMEN - 1 VIEW COMPARISON:  KUB 05/07/2013. FINDINGS: Portable AP supine view at 2248 hours enteric tube tip is at the level of the distal body. Side hole is at the level of the midbody. Negative visible bowel gas pattern. Chronic degenerative and postoperative changes in the lumbar spine. Extensive chronic iliac artery calcified atherosclerosis. IMPRESSION: Enteric tube side hole at the gastric body. Electronically Signed   By: Odessa FlemingH  Hall M.D.   On: 07/29/2018 23:12     CBC Recent Labs  Lab 07/27/18 0830 07/28/18 0545 07/29/18 0339   WBC 10.0 12.6* 10.4  HGB 10.5* 10.5* 9.7*  HCT 36.3* 37.9* 33.5*  PLT 195 195 191  MCV 98.4 103.0* 96.0  MCH 28.5 28.5 27.8  MCHC 28.9* 27.7* 29.0*  RDW 14.7 14.7 14.9  LYMPHSABS 1.2  --   --   MONOABS 0.9  --   --   EOSABS 0.1  --   --   BASOSABS 0.0  --   --     Chemistries  Recent Labs  Lab 07/27/18 0830 07/28/18 0545 07/28/18 0642 07/29/18 0339 07/29/18 0706 07/29/18 1631 07/30/18 0416  NA 142 144 143 144  --   --  142  K 4.5 5.2* 5.5* 3.5  --   --  3.4*  CL 111 110 110 109  --   --  103  CO2 24 27 25 25   --   --  24  GLUCOSE 159* 224* 238* 258*  --   --  379*  BUN 55* 65* 63* 71*  --   --  77*  CREATININE 2.60* 3.11* 3.31* 3.02*  --   --  2.95*  CALCIUM 8.6* 8.5* 8.5* 8.4*  --   --  8.7*  MG  --   --  2.5*  --  2.4 2.2 2.2  AST  --   --  15 13*  --   --   --   ALT  --   --  20 17  --   --   --   ALKPHOS  --   --  77 58  --   --   --   BILITOT  --   --  0.3 0.4  --   --   --    ------------------------------------------------------------------------------------------------------------------ Recent Labs    07/29/18 0339 07/30/18 0416  TRIG 402* 298*    Lab Results  Component Value Date   HGBA1C 7.6 (H) 07/27/2018   ------------------------------------------------------------------------------------------------------------------  No results for input(s): TSH, T4TOTAL, T3FREE, THYROIDAB in the last 72 hours.  Invalid input(s): FREET3 ------------------------------------------------------------------------------------------------------------------ No results for input(s): VITAMINB12, FOLATE, FERRITIN, TIBC, IRON, RETICCTPCT in the last 72 hours.  Coagulation profile No results for input(s): INR, PROTIME in the last 168 hours.  No results for input(s): DDIMER in the last 72 hours.  Cardiac Enzymes No results for input(s): CKMB, TROPONINI, MYOGLOBIN in the last 168 hours.  Invalid input(s): CK  ------------------------------------------------------------------------------------------------------------------    Component Value Date/Time   BNP 348.0 (H) 07/27/2018 0830   Shon Haleourage Victorio Creeden M.D on 07/30/2018 at 7:21 PM  Go to [email or web address] - for contact info  [facility] - Office  [phone]

## 2018-07-31 ENCOUNTER — Encounter (HOSPITAL_COMMUNITY): Payer: Self-pay

## 2018-07-31 ENCOUNTER — Inpatient Hospital Stay (HOSPITAL_COMMUNITY): Payer: Medicare Other

## 2018-07-31 LAB — CULTURE, RESPIRATORY W GRAM STAIN: Culture: NORMAL

## 2018-07-31 LAB — GLUCOSE, CAPILLARY
Glucose-Capillary: 306 mg/dL — ABNORMAL HIGH (ref 70–99)
Glucose-Capillary: 398 mg/dL — ABNORMAL HIGH (ref 70–99)
Glucose-Capillary: 371 mg/dL — ABNORMAL HIGH (ref 70–99)
Glucose-Capillary: 372 mg/dL — ABNORMAL HIGH (ref 70–99)

## 2018-07-31 MED ORDER — IPRATROPIUM-ALBUTEROL 0.5-2.5 (3) MG/3ML IN SOLN
3.0000 mL | Freq: Three times a day (TID) | RESPIRATORY_TRACT | Status: DC
  Administered 2018-08-01 (×2): 3 mL via RESPIRATORY_TRACT
  Filled 2018-07-31 (×2): qty 3

## 2018-07-31 MED ORDER — PANTOPRAZOLE SODIUM 40 MG PO TBEC
40.0000 mg | DELAYED_RELEASE_TABLET | Freq: Every day | ORAL | Status: DC
  Administered 2018-07-31 – 2018-08-01 (×2): 40 mg via ORAL
  Filled 2018-07-31 (×2): qty 1

## 2018-07-31 MED ORDER — GABAPENTIN 300 MG PO CAPS
300.0000 mg | ORAL_CAPSULE | Freq: Two times a day (BID) | ORAL | Status: DC
  Administered 2018-07-31 – 2018-08-01 (×3): 300 mg via ORAL
  Filled 2018-07-31 (×3): qty 1

## 2018-07-31 MED ORDER — AMLODIPINE BESYLATE 5 MG PO TABS
10.0000 mg | ORAL_TABLET | Freq: Every day | ORAL | Status: DC
  Administered 2018-07-31 – 2018-08-01 (×2): 10 mg via ORAL
  Filled 2018-07-31 (×2): qty 2

## 2018-07-31 MED ORDER — INSULIN GLARGINE 100 UNIT/ML ~~LOC~~ SOLN
30.0000 [IU] | Freq: Every day | SUBCUTANEOUS | Status: DC
  Administered 2018-07-31: 30 [IU] via SUBCUTANEOUS
  Filled 2018-07-31 (×2): qty 0.3

## 2018-07-31 MED ORDER — INSULIN GLARGINE 100 UNIT/ML ~~LOC~~ SOLN: 20.0000 [IU] | Freq: Every day | SUBCUTANEOUS | Status: DC

## 2018-07-31 MED ORDER — PREDNISONE 20 MG PO TABS
20.0000 mg | ORAL_TABLET | Freq: Every day | ORAL | Status: DC
  Administered 2018-08-01: 20 mg via ORAL
  Filled 2018-07-31 (×2): qty 1

## 2018-07-31 NOTE — Progress Notes (Signed)
Patient Demographics:    Carl Miller, is a 73 y.o. male, DOB - 1945-09-21, ZHY:[id number]  Admit date - 07/27/2018   Admitting Physician Allon Costlow Mariea Clonts, MD  Outpatient Primary MD for the patient is Barbie Banner, MD  LOS - 4   Chief Complaint  Patient presents with  . 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 --successfully extubated on 07/30/2018, awaiting SNF placement   A/p 1)Acute hypoxic and hypercapnic respiratory failure due to combination of CHF exacerbation and OSA with noncompliance with CPAP and Pneumonia--- extubated 07/30/2018, pulmonary consult from Dr. Juanetta Gosling appreciated, troponin and lactic acid not elevated --- Requiring 3 L of oxygen via nasal cannula  2)CAP with possible aspiration event--- patient is now afebrile WBC is down to 10.4 from 12.6, lactic acid is not elevated, pulmonologist consult from Dr. Juanetta Gosling appreciated, treated with IV Zosyn started on 07/28/2018 for possible aspiration pneumonia,, switched to p.o. Augmentin on 07/30/2018 blood cultures NGTD, ,  continue  bronchodilators --- Repeat chest x-ray on 07/31/2018 shows improvement in aeration  3)HFpEF--- fluid balance is negative, weight is down as noted below, oxygen requirement is down, overall improving.Marland Kitchen He presented with acute on chronic preserved EF CHF exacerbation with hypoxia, echo with EF of 60 to 65% on 07/27/2018, troponins are flat,...  Continue diuresis with IV Lasix as ordered, daily weight and fluid input and output monitoring,--weight is down to 230 pounds from 245 pounds a few days ago - 4)AKi--- last available creatinine is from 12/05/2016 it was 1.0, creatinine on admission was 2.6,...  Creatinine is around 3 , continue to monitor closely ... unable to establish if patient has underlying CKD given lack of recent lab data.... - Continue to hold Hold losartan and chlorthalidone, hold Aldactone,   5)obesity/OSA----PTA and during hospitalization patient was not compliant with CPAP --- compliance with CPAP encouraged  6)HTN---  stable, restart Coreg 25 mg p.o. twice daily, watch for bradycardia,  c/n amlodipine ,  may use IV Hydralazine 10 mg  Every 4 hours Prn for systolic blood pressure over 160 mmhg  6)DM2--A1c 7.6, reflecting fair diabetic control, stopped glipizide,  PTA patient was on Lantus 30 units nightly will restart same at this time .-- anticipate worsening hyperglycemia with steroids ---use Novolog/Humalog Sliding scale insulin with Accu-Cheks/Fingersticks as ordered   7)Social/Ethics--plan of care discussed with patient's son, daughter and grandson  , he remains a full code with no limitations to treatment at this time  Code  Status : Full code  Family Communication:   Son, daughter  and grandson at bedside on 07/28/18, Discussed with daughter on 07/30/18 [phone])  Disposition Plan  : SNF  Procedure- Intubated 07/28/2018 Extubated 07/30/2018  Consults  :  Pulm/speech  DVT Prophylaxis  :   - Heparin - SCDs   Lab Results  Component Value Date   PLT 191 07/29/2018     Inpatient Medications  Scheduled Meds: . amLODipine  10 mg Oral Daily  . amoxicillin-clavulanate  1 tablet Oral Q12H  . atorvastatin  40 mg Oral Daily  . carvedilol  25 mg Oral BID WC  . Chlorhexidine Gluconate Cloth  6 each Topical Q0600  . clopidogrel  75 mg Oral Daily  . feeding supplement (PRO-STAT SUGAR FREE 64)  60 mL Oral BID  . furosemide  60 mg Intravenous Q12H  . gabapentin  300 mg Oral BID  . guaiFENesin  600 mg Oral BID  . heparin  5,000 Units Subcutaneous Q8H  . insulin aspart  0-15 Units Subcutaneous TID WC  . insulin aspart  0-5 Units Subcutaneous QHS  . insulin glargine  30 Units Subcutaneous QHS  . ipratropium-albuterol  3 mL Inhalation Q6H  . latanoprost  1 drop Both Eyes QHS  . mouth rinse  15 mL Mouth Rinse BID  . metolazone  5 mg Oral Once  . pantoprazole  40 mg Oral Daily  . [START ON 08/01/2018] predniSONE  20 mg Oral Q breakfast  . senna  1 tablet Oral QHS  . sodium chloride flush  3 mL Intravenous Q12H  . [START ON 08/04/2018] Vitamin D (Ergocalciferol)  50,000 Units Oral Q Sat   Continuous Infusions: . sodium chloride Stopped (07/28/18 1252)   PRN Meds:.sodium chloride, acetaminophen **OR** acetaminophen, albuterol, hydrALAZINE, meclizine, ondansetron **OR** ondansetron (ZOFRAN) IV, polyethylene glycol, sodium chloride flush, traZODone   Anti-infectives (From admission, onward)   Start     Dose/Rate Route Frequency Ordered Stop   07/30/18 1245  amoxicillin-clavulanate (AUGMENTIN) 500-125 MG per tablet 500 mg     1 tablet Oral Every 12 hours 07/30/18 1231     07/28/18 1145  piperacillin-tazobactam (ZOSYN) IVPB 3.375 g  Status:  Discontinued     3.375 g 12.5 mL/hr over 240 Minutes Intravenous Every 8 hours 07/28/18 1143 07/30/18 1231        Objective:   Vitals:   07/31/18 1628 07/31/18 1700 07/31/18 1701 07/31/18 1704  BP:  (!) 153/65    Pulse: 69 73    Resp: 11 19    Temp: 98.1 F (36.7 C)     TempSrc: Oral     SpO2: 96% (!) 88% 92% 96%   Weight:      Height:        Wt Readings from Last 3 Encounters:  07/30/18 104.7 kg  08/14/17 104.3 kg  05/15/17 103.7 kg    Intake/Output Summary (Last 24 hours) at 07/31/2018 1743 Last data filed at 07/31/2018 1600 Gross per 24 hour  Intake 9 ml  Output 1700 ml  Net -1691 ml    Physical Exam Gen:- Awake Alert, talking HEENT:- [place].AT, No sclera icterus Neck-Supple Neck,No JVD,.  Nose- [place] 3 L/min Ears- very HOH Lungs-diminished in the bases, scattered rhonchi  CV- S1, S2 normal Abd-  +ve B.Sounds, Abd Soft, No tenderness,    Extremity/Skin:-1 +  edema,   , good pedal pulses Psych-affect is appropriate, oriented x3 Neuro-no new focal deficits, no tremors    Data Review:   Micro  Results Recent Results (from the past 240 hour(s))  SARS Coronavirus 2 (CEPHEID - Performed in [facility] hospital lab), Hosp Order     Status: None   Collection Time: 07/27/18  8:29 AM   Specimen: Nasopharyngeal Swab  Result Value Ref Range Status   SARS Coronavirus 2 NEGATIVE NEGATIVE Final    Comment: (NOTE) If result is NEGATIVE SARS-CoV-2 target nucleic acids are NOT DETECTED. The SARS-CoV-2 RNA is generally detectable in upper and lower  respiratory specimens during the acute phase of infection. The lowest  concentration of SARS-CoV-2 viral copies this assay can detect is 250  copies / mL. A negative result does not preclude SARS-CoV-2 infection  and should not be used as the sole basis for treatment or other  patient management decisions.  A negative result may occur with  improper specimen collection / handling, submission of specimen other  than nasopharyngeal swab, presence of viral mutation(s) within the  areas targeted by this assay, and inadequate number of viral copies  (<250 copies / mL). A negative result must be combined with clinical  observations, patient history, and epidemiological information. If result is POSITIVE SARS-CoV-2 target nucleic acids are DETECTED. The  SARS-CoV-2 RNA is generally detectable in upper and lower  respiratory specimens dur ing the acute phase of infection.  Positive  results are indicative of active infection with SARS-CoV-2.  Clinical  correlation with patient history and other diagnostic information is  necessary to determine patient infection status.  Positive results do  not rule out bacterial infection or co-infection with other viruses. If result is PRESUMPTIVE POSTIVE SARS-CoV-2 nucleic acids MAY BE PRESENT.   A presumptive positive result was obtained on the submitted specimen  and confirmed on repeat testing.  While 2019 novel coronavirus  (SARS-CoV-2) nucleic acids may be present in the submitted sample  additional confirmatory testing may be necessary for epidemiological  and / or clinical management purposes  to differentiate between  SARS-CoV-2 and other Sarbecovirus currently known to infect humans.  If clinically indicated additional testing with an alternate test  methodology [phone]) is advised. The SARS-CoV-2 RNA is generally  detectable in upper and lower respiratory sp ecimens during the acute  phase of infection. The expected result is Negative. Fact Sheet for Patients:  StrictlyIdeas.no Fact Sheet for Healthcare Providers: BankingDealers.co.za This test is not yet approved or cleared by the [place] FDA and has been authorized for detection and/or diagnosis of SARS-CoV-2 by FDA under an Emergency Use Authorization (EUA).  This EUA will remain in effect (meaning this test can be used) for the duration of the COVID-19 declaration under Section 564(b)(1) of the Act, 21 U.S.C. section 360bbb-3(b)(1), unless the authorization is terminated or revoked sooner. Performed at [facility], [address], [place], [place] [address]   MRSA PCR Screening     Status: None   Collection Time: 07/28/18  6:54 AM   Specimen: Nasal Mucosa; Nasopharyngeal  Result  Value Ref Range Status   MRSA by PCR NEGATIVE NEGATIVE Final    Comment:        The GeneXpert MRSA Assay (FDA approved for NASAL specimens only), is one component of a comprehensive MRSA colonization surveillance program. It is not intended to diagnose MRSA infection nor to guide or monitor treatment for MRSA infections. Performed at [facility], [address], [place], [place] [address]   Culture, blood (Routine X 2) w Reflex to ID Panel     Status: None (Preliminary result)   Collection Time:  07/28/18  8:34 AM   Specimen: BLOOD LEFT HAND  Result Value Ref Range Status   Specimen Description BLOOD LEFT HAND Blood Culture adequate volume  Final   Special Requests BOTTLES DRAWN AEROBIC AND ANAEROBIC  Final   Culture   Final    NO GROWTH 3 DAYS Performed at [facility], [address], [place], [place] [address]    Report Status PENDING  Incomplete  Culture, blood (Routine X 2) w Reflex to ID Panel     Status: None (Preliminary result)   Collection Time: 07/28/18  6:42 PM   Specimen: BLOOD LEFT ARM  Result Value Ref Range Status   Specimen Description BLOOD LEFT ARM  Final   Special Requests   Final    BOTTLES DRAWN AEROBIC ONLY Blood Culture adequate volume   Culture   Final    NO GROWTH 3 DAYS Performed at [facility], [address] [place], [place] [address]    Report Status PENDING  Incomplete  Culture, respiratory     Status: None   Collection Time: 07/29/18  1:23 AM   Specimen: SPU  Result Value Ref Range Status   Specimen Description   Final    SPUTUM Performed at [facility], [address] [place], [place] [address]    Special Requests   Final    NONE Performed at [facility], [address] [place], [place] [address]    Gram Stain   Final    RARE WBC PRESENT, PREDOMINANTLY PMN RARE GRAM POSITIVE COCCI    Culture   Final    FEW Consistent with normal respiratory flora. Performed at [facility], [address] [place], [place] [address]     Report Status 07/31/2018 FINAL  Final    Radiology Reports Dg Chest Port 1 View  Result Date: 07/31/2018 CLINICAL DATA:  Shortness of breath. EXAM: PORTABLE CHEST 1 VIEW COMPARISON:  Radiograph of July 30, 2018. FINDINGS: Stable cardiomegaly. Atherosclerosis of thoracic aorta is noted. No pneumothorax is noted. Slightly decreased bibasilar opacities are noted suggesting improving atelectasis or infiltrates. Bony thorax is unremarkable. IMPRESSION: Slightly improved bilateral lung opacities as described above. Aortic Atherosclerosis (ICD10-I70.0). Electronically Signed   By: Lupita Raider M.D.   On: 07/31/2018 07:43   Dg Chest Port 1 View  Result Date: 07/30/2018 CLINICAL DATA:  Followup exam. Intubated patient. Respiratory failure. EXAM: PORTABLE CHEST 1 VIEW COMPARISON:  07/29/2018 and earlier studies. FINDINGS: Endotracheal tube and nasogastric tube are stable and well positioned. Bilateral vascular congestion with lower lung zone opacity consistent with a combination of pleural effusions and atelectasis. Findings stable from the most recent prior exam. Lung volumes remain low. IMPRESSION: 1. No significant change from the previous day's study. 2. Small bilateral pleural effusions with associated lung base opacity consistent with atelectasis and/or pneumonia. Central vascular congestion. 3. Stable well-positioned support apparatus. Electronically Signed   By: Amie Portland M.D.   On: 07/30/2018 08:53   Dg Chest Port 1 View  Result Date: 07/29/2018 CLINICAL DATA:  Respiratory failure. EXAM: PORTABLE CHEST 1 VIEW COMPARISON:  07/28/2018 FINDINGS: Lung volumes remain low. There is lung base opacity that is similar to the prior study, consistent with bilateral effusions with either atelectasis or infection. Endotracheal tube and nasal/orogastric tube are stable in well positioned. IMPRESSION: 1. No significant change from the most recent prior study allowing for differences in lung volume and patient  positioning. 2. Bilateral pleural effusions with persistent lung base opacities, the latter finding which  may reflect atelectasis, pneumonia or a combination. 3. Stable support apparatus. Electronically Signed   By: Amie Portlandavid  Ormond M.D.   On: 07/29/2018 08:43   Dg Chest Port 1 View  Result Date: 07/28/2018 CLINICAL DATA:  Dyspnea. Respiratory abnormalities. Placement of endotracheal tube and nasogastric tube. EXAM: PORTABLE CHEST 1 VIEW COMPARISON:  07/27/2018 FINDINGS: Endotracheal tube is 2.6 cm above the carina. Nasogastric tube extends into the abdomen but the tip is beyond the image. Cardiac pad overlying the left chest. Increased densities at the left lung base suggestive for consolidation and/or pleural fluid. Increased hazy densities at the right lung base. Negative for pneumothorax. Heart size is upper limits of normal and stable. IMPRESSION: 1. Endotracheal tube is appropriately positioned. Nasogastric tube extends into the abdomen. 2. Increased basilar chest densities bilaterally. Findings could represent a combination of consolidation and pleural fluid. Electronically Signed   By: Richarda OverlieAdam  Henn M.D.   On: 07/28/2018 08:44   Dg Chest Port 1 View  Result Date: 07/27/2018 CLINICAL DATA:  Shortness of breath and decreased oxygen saturation EXAM: PORTABLE CHEST 1 VIEW COMPARISON:  None. FINDINGS: There is cardiomegaly with pulmonary venous hypertension. There is interstitial edema with small pleural effusions. There is no frank airspace consolidation. No adenopathy. There is aortic atherosclerosis. No bone lesions. IMPRESSION: Pulmonary vascular congestion with small pleural effusions and interstitial edema. Suspect a degree of congestive heart failure. No consolidation. Aortic Atherosclerosis (ICD10-I70.0). Electronically Signed   By: Bretta BangWilliam  Woodruff III M.D.   On: 07/27/2018 08:47   Dg Abd Portable 1v  Result Date: 07/29/2018 CLINICAL DATA:  73 year old male nasogastric tube placement. EXAM:  PORTABLE ABDOMEN - 1 VIEW COMPARISON:  KUB 05/07/2013. FINDINGS: Portable AP supine view at 2248 hours enteric tube tip is at the level of the distal body. Side hole is at the level of the midbody. Negative visible bowel gas pattern. Chronic degenerative and postoperative changes in the lumbar spine. Extensive chronic iliac artery calcified atherosclerosis. IMPRESSION: Enteric tube side hole at the gastric body. Electronically Signed   By: Odessa FlemingH  Hall M.D.   On: 07/29/2018 23:12     CBC Recent Labs  Lab 07/27/18 0830 07/28/18 0545 07/29/18 0339  WBC 10.0 12.6* 10.4  HGB 10.5* 10.5* 9.7*  HCT 36.3* 37.9* 33.5*  PLT 195 195 191  MCV 98.4 103.0* 96.0  MCH 28.5 28.5 27.8  MCHC 28.9* 27.7* 29.0*  RDW 14.7 14.7 14.9  LYMPHSABS 1.2  --   --   MONOABS 0.9  --   --   EOSABS 0.1  --   --   BASOSABS 0.0  --   --     Chemistries  Recent Labs  Lab 07/27/18 0830 07/28/18 0545 07/28/18 0642 07/29/18 0339 07/29/18 0706 07/29/18 1631 07/30/18 0416  NA 142 144 143 144  --   --  142  K 4.5 5.2* 5.5* 3.5  --   --  3.4*  CL 111 110 110 109  --   --  103  CO2 24 27 25 25   --   --  24  GLUCOSE 159* 224* 238* 258*  --   --  379*  BUN 55* 65* 63* 71*  --   --  77*  CREATININE 2.60* 3.11* 3.31* 3.02*  --   --  2.95*  CALCIUM 8.6* 8.5* 8.5* 8.4*  --   --  8.7*  MG  --   --  2.5*  --  2.4 2.2 2.2  AST  --   --  15 13*  --   --   --   ALT  --   --  20 17  --   --   --   ALKPHOS  --   --  77 58  --   --   --   BILITOT  --   --  0.3 0.4  --   --   --    ------------------------------------------------------------------------------------------------------------------ Recent Labs    07/29/18 0339 07/30/18 0416  TRIG 402* 298*    Lab Results  Component Value Date   HGBA1C 7.6 (H) 07/27/2018   ------------------------------------------------------------------------------------------------------------------ No results for input(s): TSH, T4TOTAL, T3FREE, THYROIDAB in the last 72 hours.  Invalid  input(s): FREET3 ------------------------------------------------------------------------------------------------------------------ No results for input(s): VITAMINB12, FOLATE, FERRITIN, TIBC, IRON, RETICCTPCT in the last 72 hours.  Coagulation profile No results for input(s): INR, PROTIME in the last 168 hours.  No results for input(s): DDIMER in the last 72 hours.  Cardiac Enzymes No results for input(s): CKMB, TROPONINI, MYOGLOBIN in the last 168 hours.  Invalid input(s): CK ------------------------------------------------------------------------------------------------------------------    Component Value Date/Time   BNP 348.0 (H) 07/27/2018 0830   Shon Haleourage Akua Blethen M.D on 07/31/2018 at 5:43 PM  Go to [email or web address] - for contact info  [facility] - Office  [phone]

## 2018-07-31 NOTE — Progress Notes (Signed)
Inpatient Diabetes Program Recommendations  AACE/ADA: New Consensus Statement on Inpatient Glycemic Control   Target Ranges:  Prepandial:   less than 140 mg/dL      Peak postprandial:   less than 180 mg/dL (1-2 hours)      Critically ill patients:  140 - 180 mg/dL   Results for ELIZA, GRISSINGER (MRN [id number]) as of 07/31/2018 11:23  Ref. Range 07/30/2018 07:38 07/30/2018 11:50 07/30/2018 16:12 07/30/2018 22:09 07/31/2018 07:14  Glucose-Capillary Latest Ref Range: 70 - 99 mg/dL 355 (H) 361 (H) 392 (H) 345 (H) 306 (H)   Review of Glycemic Control  Diabetes history: DM2 Outpatient Diabetes medications: Lantus 30 units QHS, Glipizide 5 mg QAM Current orders for Inpatient glycemic control: Lantus 16 units QHS, Novolog 0-15 units TID, Novolog 0-5 units QHS; Prednisone 50 mg QAM  Inpatient Diabetes Program Recommendations:   Insulin-Basal: Please consider increasing Lantus to 21 units QHS.  Insulin-Meal Coverage: If steroids are continued, please consider ordering Novolog 5 units TID with meals for meal coverage if patient eats at least 50% of meals.  Thanks, Barnie Alderman, RN, MSN, CDE Diabetes Coordinator Inpatient Diabetes Program [phone] (Team Pager from 8am to 5pm)

## 2018-07-31 NOTE — Progress Notes (Signed)
  Speech Language Pathology Treatment: Dysphagia  Patient Details Name: Carl Miller MRN: [id number] DOB: 1945/10/05 Today's Date: 07/31/2018 Time: 0737-1062 SLP Time Calculation (min) (ACUTE ONLY): 14 min  Assessment / Plan / Recommendation Clinical Impression  Pt seen in room for ongoing diagnostic dysphagia intervention following BSE completed yesterday shortly after extubation. Pt has reportedly been tolerating mechanical soft diet and thin liquids without incident. He found his dentures and was agreeable to regular texture trials again this date. Pt without overt signs or symptoms of aspiration, no oral residuals noted. Pt with cue to avoid talking while eating and continue with slow rate of intake due to compromised respiratory status. Will advance to regular textures and sign off. RN/Pt in agreement with plan of care.    HPI HPI: 73 year old with past medical history relevant for obesity/OSA, history of HFpEF, hypertension and diabetes admitted on 07/27/2018 with acute on chronic CHF exacerbation after presenting with hypoxia, significant dyspnea, 20 pound weight gain and significant lower extremity edema. Pt extubated this AM and consumed a full liquid lunch per RN without incident.      SLP Plan  Discharge SLP treatment due to (comment);All goals met       Recommendations  Diet recommendations: Regular;Thin liquid Liquids provided via: Cup;Straw Medication Administration: Whole meds with liquid Supervision: Patient able to self feed Compensations: Slow rate;Small sips/bites Postural Changes and/or Swallow Maneuvers: Seated upright 90 degrees;Upright 30-60 min after meal                Oral Care Recommendations: Oral care BID;Staff/trained caregiver to provide oral care Follow up Recommendations: None SLP Visit Diagnosis: Dysphagia, unspecified (R13.10) Plan: Discharge SLP treatment due to (comment);All goals met       Thank you,  Genene Churn,  [place]                 [place] 07/31/2018, 12:33 PM

## 2018-07-31 NOTE — NC FL2 (Signed)
[place] LEVEL OF CARE SCREENING TOOL     IDENTIFICATION  Patient Name: Carl Miller Birthdate: [birth year] Sex: male Admission Date (Current Location): 07/27/2018  [facility] and [place] Number:  [employer] and Address:  [place] [address] [place]      Provider Number: [id number]  Attending Physician Name and Address:  Roxan Hockey, MD  Relative Name and Phone Number:       Current Level of Care: Hospital Recommended Level of Care: [place] Prior Approval Number:    Date Approved/Denied:   PASRR Number: [id number]  Discharge Plan: SNF    Current Diagnoses: Patient Active Problem List   Diagnosis Date Noted  . Acute exacerbation of CHF (congestive heart failure)/HFpEF 07/27/2018  . Acute Respiratory failure with hypoxia ([place]) 07/27/2018  . Acute on chronic diastolic CHF (congestive heart failure) /HFpEF 07/27/2018  . Pain due to onychomycosis of toenails of both feet 07/13/2018  . Intracranial vascular stenosis 05/18/2017  . TIA (transient ischemic attack) 03/07/2017  . Type 2 diabetes mellitus with hyperglycemia ([place]) 09/16/2016  . Arthritis 05/05/2015  . 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 Generalized muscle weakness 05/05/2015  . GERD (gastroesophageal reflux disease) 05/05/2015  . Glaucoma 05/05/2015  . Hearing loss 05/05/2015  . Hypertension 05/05/2015  . Migraine headache 05/05/2015  . Situational depression 05/05/2015  . Venous stasis 05/05/2015  . Hyperlipidemia 11/17/2014  . Essential (primary) hypertension 11/17/2014  . Venous intermittent claudication   . Bilateral leg edema   . OSA (obstructive sleep apnea) 05/23/2014  . Hypoxemia 05/23/2014  . Worsening headaches 01/28/2014  . Type 2 diabetes mellitus with hyperglycemia, with long-term current use of insulin ([place]) 01/28/2014  . Snoring 01/28/2014  . Cluster headache 01/28/2014  . Nasal congestion with  rhinorrhea 01/28/2014  . 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 Uncontrolled type 2 diabetes mellitus with chronic kidney disease ([place]) 01/11/2014  . Abnormal nuclear stress test 12/16/2013    Orientation RESPIRATION BLADDER Height & Weight     Self, Time, Situation  O2(cpap at night) Indwelling catheter Weight: 104.7 kg Height:  6' (182.9 cm)  BEHAVIORAL SYMPTOMS/MOOD NEUROLOGICAL BOWEL NUTRITION STATUS      Continent Diet  AMBULATORY STATUS COMMUNICATION OF NEEDS Skin   Extensive Assist Verbally Normal                       Personal Care Assistance Level of Assistance  Bathing, Dressing, Feeding Bathing Assistance: Limited assistance Feeding assistance: Independent Dressing Assistance: Limited assistance     Functional Limitations Info  Sight, Hearing, Speech Sight Info: Adequate Hearing Info: Adequate Speech Info: Adequate    SPECIAL CARE FACTORS FREQUENCY  PT (By licensed PT)     PT Frequency: 5x/week              Contractures Contractures Info: Not present    Additional Factors Info  Code Status, Allergies, Insulin Sliding Scale Code Status Info: FULL CODE Allergies Info: ASPIRIN, TAPE   Insulin Sliding Scale Info: SEE DC SUMMARY       Current Medications (07/31/2018):  This is the current hospital active medication list Current Facility-Administered Medications  Medication Dose Route Frequency Provider Last Rate Last Dose  . 0.9 %  sodium chloride infusion  250 mL Intravenous PRN Reubin Milan, MD   Stopped at 07/28/18 1252  . acetaminophen (TYLENOL) tablet 650 mg  650 mg Oral Q6H PRN Roxan Hockey, MD  Or  . acetaminophen (TYLENOL) suppository 650 mg  650 mg Rectal Q6H PRN Emokpae, Courage, MD      . albuterol (PROVENTIL) (2.5 MG/3ML) 0.083% nebulizer solution 2.5 mg  2.5 mg Nebulization Q2H PRN Bobette Mortiz, David Manuel, MD      . amLODipine (NORVASC) tablet 10 mg  10 mg Oral Daily Shon HaleEmokpae, Courage, MD   10 mg at 07/31/18 0942  .  amoxicillin-clavulanate (AUGMENTIN) 500-125 MG per tablet 500 mg  1 tablet Oral Q12H Emokpae, Courage, MD   500 mg at 07/31/18 0942  . atorvastatin (LIPITOR) tablet 40 mg  40 mg Oral Daily Emokpae, Courage, MD   40 mg at 07/31/18 0942  . carvedilol (COREG) tablet 25 mg  25 mg Oral BID WC Emokpae, Courage, MD   25 mg at 07/31/18 0807  . Chlorhexidine Gluconate Cloth 2 % PADS 6 each  6 each Topical Q0600 Bobette Mortiz, David Manuel, MD   6 each at 07/31/18 [phone]  . clopidogrel (PLAVIX) tablet 75 mg  75 mg Oral Daily Emokpae, Courage, MD   75 mg at 07/31/18 0942  . feeding supplement (PRO-STAT SUGAR FREE 64) liquid 60 mL  60 mL Oral BID Emokpae, Courage, MD   60 mL at 07/30/18 2313  . furosemide (LASIX) injection 60 mg  60 mg Intravenous Q12H Bobette Mortiz, David Manuel, MD   60 mg at 07/31/18 [id number]  . gabapentin (NEURONTIN) capsule 300 mg  300 mg Oral BID Mariea ClontsEmokpae, Courage, MD   300 mg at 07/31/18 1022  . guaiFENesin (MUCINEX) 12 hr tablet 600 mg  600 mg Oral BID Mariea ClontsEmokpae, Courage, MD   600 mg at 07/31/18 0942  . heparin injection 5,000 Units  5,000 Units Subcutaneous Q8H Bobette Mortiz, David Manuel, MD   5,000 Units at 07/31/18 [id number]  . hydrALAZINE (APRESOLINE) injection 10 mg  10 mg Intravenous Q6H PRN Bobette Mortiz, David Manuel, MD   10 mg at 07/30/18 0133  . insulin aspart (novoLOG) injection 0-15 Units  0-15 Units Subcutaneous TID WC Shon HaleEmokpae, Courage, MD   11 Units at 07/31/18 0807  . insulin aspart (novoLOG) injection 0-5 Units  0-5 Units Subcutaneous QHS Shon HaleEmokpae, Courage, MD   4 Units at 07/30/18 2338  . insulin glargine (LANTUS) injection 16 Units  16 Units Subcutaneous QHS Shon HaleEmokpae, Courage, MD   16 Units at 07/30/18 2338  . ipratropium-albuterol (DUONEB) 0.5-2.5 (3) MG/3ML nebulizer solution 3 mL  3 mL Inhalation Q6H Emokpae, Courage, MD   3 mL at 07/31/18 0804  . latanoprost (XALATAN) 0.005 % ophthalmic solution 1 drop  1 drop Both Eyes QHS Bobette Mortiz, David Manuel, MD   1 drop at 07/30/18 2340  . meclizine (ANTIVERT) tablet 25 mg   25 mg Oral TID PRN Bobette Mortiz, David Manuel, MD      . MEDLINE mouth rinse  15 mL Mouth Rinse BID Mariea ClontsEmokpae, Courage, MD   15 mL at 07/31/18 0945  . metolazone (ZAROXOLYN) tablet 5 mg  5 mg Oral Once Bobette Mortiz, David Manuel, MD      . ondansetron [facility](ZOFRAN) tablet 4 mg  4 mg Oral Q6H PRN Shon HaleEmokpae, Courage, MD       Or  . ondansetron (ZOFRAN) injection 4 mg  4 mg Intravenous Q6H PRN Emokpae, Courage, MD      . pantoprazole (PROTONIX) EC tablet 40 mg  40 mg Oral Daily Emokpae, Courage, MD   40 mg at 07/31/18 0942  . polyethylene glycol (MIRALAX / GLYCOLAX) packet 17 g  17 g Oral Daily PRN Shon HaleEmokpae, Courage, MD      .  predniSONE (DELTASONE) tablet 50 mg  50 mg Oral Q breakfast Mariea ClontsEmokpae, Courage, MD   50 mg at 07/31/18 0807  . senna (SENOKOT) tablet 8.6 mg  1 tablet Oral QHS Emokpae, Courage, MD   8.6 mg at 07/30/18 2312  . sodium chloride flush (NS) 0.9 % injection 3 mL  3 mL Intravenous Q12H Bobette Mortiz, David Manuel, MD   3 mL at 07/31/18 0943  . sodium chloride flush (NS) 0.9 % injection 3 mL  3 mL Intravenous PRN Bobette Mortiz, David Manuel, MD      . traZODone (DESYREL) tablet 50 mg  50 mg Oral QHS PRN Shon HaleEmokpae, Courage, MD      . Melene Muller[START ON 08/04/2018] Vitamin D (Ergocalciferol) (DRISDOL) capsule 50,000 Units  50,000 Units Oral Q Sat Emokpae, Courage, MD         Discharge Medications: Please see discharge summary for a list of discharge medications.  Relevant Imaging Results:  Relevant Lab Results:   Additional Information SSN [id number]  Sharyn Brilliant, Chrystine OilerSharley Diane, RN

## 2018-07-31 NOTE — Progress Notes (Signed)
Order obtained for continuous SPO2 monitoring on the floor (Re: previous RR event). Patient transferred to [address] without difficulty and patient is awake and oriented and in no obvious or stated distress.  Resp Therapy notified for CPAP and continuous SPO2 monitoring order.

## 2018-07-31 NOTE — Plan of Care (Signed)
  Problem: Acute Rehab PT Goals(only PT should resolve) Goal: Pt Will Go Supine/Side To Sit Outcome: Progressing Flowsheets (Taken 07/31/2018 1213) Pt will go Supine/Side to Sit: with supervision Goal: Patient Will Transfer Sit To/From Stand Outcome: Progressing Flowsheets (Taken 07/31/2018 1213) Patient will transfer sit to/from stand:  with min guard assist  with minimal assist Goal: Pt Will Transfer Bed To Chair/Chair To Bed Outcome: Progressing Flowsheets (Taken 07/31/2018 1213) Pt will Transfer Bed to Chair/Chair to Bed: with min assist Goal: Pt Will Ambulate Outcome: Progressing Flowsheets (Taken 07/31/2018 1213) Pt will Ambulate:  50 feet  with minimal assist  with rolling walker   12:14 PM, 07/31/18 Lonell Grandchild, MPT Physical Therapist with [facility] [phone] office [phone] mobile phone

## 2018-07-31 NOTE — Evaluation (Signed)
Physical Therapy Evaluation Patient Details Name: Carl Miller MRN: [id number] DOB: February 20, 1945 Today's Date: 07/31/2018   History of Present Illness  Carl Miller  is a 73 y.o. male presents to the ED with complaints of increasing lower extremity edema, about 20 pound weight gain, worsening shortness of breath,  orthopnea and abdominal swelling-over the last 2 to 3 weeks particularly worse over the last 2 days    Clinical Impression  Patient limited for functional mobility as stated below secondary to BLE weakness, fatigue and poor standing balance.  Patient demonstrates labored movement for sitting up at bedside and limited to a few unsteady shuffling steps at bedside due to BLE weakness and poor coordination and tolerated sitting up in chair after therapy - nursing staff aware.  Patient will benefit from continued physical therapy in hospital and recommended venue below to increase strength, balance, endurance for safe ADLs and gait.     Follow Up Recommendations SNF    Equipment Recommendations  None recommended by PT    Recommendations for Other Services       Precautions / Restrictions Precautions Precautions: Fall Restrictions Weight Bearing Restrictions: No      Mobility  Bed Mobility Overal bed mobility: Needs Assistance Bed Mobility: Supine to Sit     Supine to sit: Min assist     General bed mobility comments: slow labored movement  Transfers Overall transfer level: Needs assistance Equipment used: Rolling walker (2 wheeled) Transfers: Sit to/from UGI CorporationStand;Stand Pivot Transfers Sit to Stand: Mod assist Stand pivot transfers: Mod assist       General transfer comment: very unsteady on feet with frequent shuffling  Ambulation/Gait Ambulation/Gait assistance: Mod assist;Max assist Gait Distance (Feet): 4 Feet Assistive device: Rolling walker (2 wheeled) Gait Pattern/deviations: Decreased step length - right;Decreased step length - left;Decreased stride  length Gait velocity: slow   General Gait Details: limited to 4-5 short unsteady shuflling steps at bedside due to BLE weakness and fair/poor coordination of legs, on 3 LPM with SpO2 dropping from 92% to 88%  Stairs            Wheelchair Mobility    Modified Rankin (Stroke Patients Only)       Balance Overall balance assessment: Needs assistance Sitting-balance support: Feet supported;No upper extremity supported Sitting balance-Leahy Scale: Fair     Standing balance support: Bilateral upper extremity supported;During functional activity Standing balance-Leahy Scale: Poor Standing balance comment: fair/poor using RW                             Pertinent Vitals/Pain Pain Assessment: No/denies pain    Home Living Family/patient expects to be discharged to:: Private residence Living Arrangements: Alone Available Help at Discharge: Family;Available PRN/intermittently Type of Home: Mobile home Home Access: Ramped entrance     Home Layout: One level Home Equipment: Walker - 2 wheels;Walker - 4 wheels;Cane - single point;Shower seat;Bedside commode      Prior Function Level of Independence: Independent with assistive device(s)         Comments: Retail buyerCommunity ambulator using SPC, drives     Hand Dominance        Extremity/Trunk Assessment   Upper Extremity Assessment Upper Extremity Assessment: Generalized weakness    Lower Extremity Assessment Lower Extremity Assessment: Generalized weakness    Cervical / Trunk Assessment Cervical / Trunk Assessment: Normal  Communication   Communication: HOH;Other (comment)(Ok when using hearing aides)  Cognition Arousal/Alertness: Awake/alert Behavior [facility]  for tasks assessed/performed Overall Cognitive Status: Within Functional Limits for tasks assessed                                        General Comments      Exercises     Assessment/Plan    PT Assessment  Patient needs continued PT services  PT Problem List Decreased strength;Decreased activity tolerance;Decreased balance;Decreased mobility       PT Treatment Interventions Gait training;Stair training;Functional mobility training;Therapeutic activities;Therapeutic exercise;Patient/family education    PT Goals (Current goals can be found in the Care Plan section)  Acute Rehab PT Goals Patient Stated Goal: return home after rehab PT Goal Formulation: With patient Time For Goal Achievement: 08/14/18 Potential to Achieve Goals: Good    Frequency Min 3X/week   Barriers to discharge        Co-evaluation               AM-PAC PT "6 Clicks" Mobility  Outcome Measure Help needed turning from your back to your side while in a flat bed without using bedrails?: A Little Help needed moving from lying on your back to sitting on the side of a flat bed without using bedrails?: A Little Help needed moving to and from a bed to a chair (including a wheelchair)?: A Lot Help needed standing up from a chair using your arms (e.g., wheelchair or bedside chair)?: A Lot Help needed to walk in hospital room?: A Lot Help needed climbing 3-5 steps with a railing? : Total 6 Click Score: 13    End of Session Equipment Utilized During Treatment: Oxygen Activity Tolerance: Patient tolerated treatment well;Patient limited by fatigue Patient left: in chair;with call bell/phone within reach Nurse Communication: Mobility status PT Visit Diagnosis: Unsteadiness on feet (R26.81);Other abnormalities of gait and mobility (R26.89);Muscle weakness (generalized) (M62.81)    Time: 3662-9476 PT Time Calculation (min) (ACUTE ONLY): 34 min   Charges:   PT Evaluation $PT Eval Moderate Complexity: 1 Mod PT Treatments $Therapeutic Activity: 23-37 mins        12:12 PM, 07/31/18 Lonell Grandchild, MPT Physical Therapist with [facility] [phone] office [phone] mobile phone

## 2018-08-01 DIAGNOSIS — I5033 Acute on chronic diastolic (congestive) heart failure: Secondary | ICD-10-CM

## 2018-08-01 LAB — BASIC METABOLIC PANEL
Anion gap: 12 (ref 5–15)
BUN: 99 mg/dL — ABNORMAL HIGH (ref 8–23)
CO2: 28 mmol/L (ref 22–32)
Calcium: 8 mg/dL — ABNORMAL LOW (ref 8.9–10.3)
Chloride: 101 mmol/L (ref 98–111)
Creatinine, Ser: 3.35 mg/dL — ABNORMAL HIGH (ref 0.61–1.24)
GFR calc Af Amer: 20 mL/min — ABNORMAL LOW (ref >60)
GFR calc non Af Amer: 17 mL/min — ABNORMAL LOW (ref >60)
Glucose, Bld: 262 mg/dL — ABNORMAL HIGH (ref 70–99)
Potassium: 3.5 mmol/L (ref 3.5–5.1)
Sodium: 141 mmol/L (ref 135–145)

## 2018-08-01 LAB — GLUCOSE, CAPILLARY
Glucose-Capillary: 278 mg/dL — ABNORMAL HIGH (ref 70–99)
Glucose-Capillary: 333 mg/dL — ABNORMAL HIGH (ref 70–99)

## 2018-08-01 MED ORDER — AMOXICILLIN-POT CLAVULANATE 500-125 MG PO TABS: 1.0000 | ORAL_TABLET | Freq: Two times a day (BID) | ORAL | 0 refills | Status: AC

## 2018-08-01 MED ORDER — GUAIFENESIN ER 600 MG PO TB12: 600.0000 mg | ORAL_TABLET | Freq: Two times a day (BID) | ORAL | 0 refills | Status: AC

## 2018-08-01 MED ORDER — PREDNISONE 20 MG PO TABS: 20.0000 mg | ORAL_TABLET | Freq: Every day | ORAL | 0 refills | Status: AC

## 2018-08-01 MED ORDER — PRO-STAT SUGAR FREE PO LIQD: 60.0000 mL | Freq: Two times a day (BID) | ORAL | 0 refills | Status: AC

## 2018-08-01 MED ORDER — AMLODIPINE BESYLATE 10 MG PO TABS: 10.0000 mg | ORAL_TABLET | Freq: Every day | ORAL | 0 refills | Status: AC

## 2018-08-01 MED ORDER — SENNA 8.6 MG PO TABS: 1.0000 | ORAL_TABLET | Freq: Every day | ORAL | 0 refills | Status: AC

## 2018-08-01 MED ORDER — POLYETHYLENE GLYCOL 3350 17 G PO PACK: 17.0000 g | PACK | Freq: Every day | ORAL | 0 refills | Status: AC | PRN

## 2018-08-01 MED ORDER — INSULIN GLARGINE 100 UNIT/ML ~~LOC~~ SOLN: 34.0000 [IU] | Freq: Every day | SUBCUTANEOUS | 11 refills | Status: AC

## 2018-08-01 NOTE — Discharge Summary (Signed)
Physician Discharge Summary  Carl Miller:[id number] DOB: 1945/12/23 DOA: 07/27/2018  PCP: Christain Sacramento, MD  Admit date: 07/27/2018  Discharge date: 08/01/2018  Admitted From:Home  Disposition:  SNF  Recommendations for Outpatient Follow-up:  1. Follow up with PCP in 1-2 weeks 2. Please obtain BMP in 3-5 days to reassess renal function.  If stable or improved, reconsider ordering home medications were discontinued such as losartan, chlorthalidone, and Aldactone 3. Continue on prednisone for 5 more days 4. Continue on Augmentin as ordered for pneumonia treatment for 2 more days to complete course of treatment 5. Continue on Lantus at increased dose of 35 units at bedtime 6. Encourage compliance with CPAP mask at night 7. Continue on home 3 L nasal cannula  Home Health: None  Equipment/Devices: Nasal cannula oxygen and CPAP  Discharge Condition: Stable  CODE STATUS: Full  Diet recommendation: Heart Healthy/carb modified  Brief/Interim Summary: 73 year old with past medical history relevant for obesity/OSA, history of HFpEF, hypertension and diabetes admitted on 07/27/2018 with acute on chronic CHF exacerbation after presenting with hypoxia, significant dyspnea, 20 pound weight gain and significant lower extremity edema . --successfully extubated on 07/30/2018, awaiting SNF placement, now with bed available.   A/p 1)Acute hypoxic and hypercapnic respiratory failure due to combination of CHF exacerbation and OSA with noncompliance with CPAP and Pneumonia--- extubated 07/30/2018, pulmonary consult from Dr. Luan Pulling appreciated, troponin and lactic acid not elevated --- Requiring 3 L of oxygen via nasal cannula  2)CAP with possible aspiration event--- patient is now afebrile WBC is down to 10.4 from 12.6, lactic acid is not elevated, pulmonologist consult from Dr. Luan Pulling appreciated, treated with IV Zosyn started on 07/28/2018 for possible aspiration pneumonia,, switched to p.o.  Augmentin on 07/30/2018 blood cultures NGTD, ,  continue bronchodilators --- Repeat chest x-ray on 07/31/2018 shows improvement in aeration -Continue Augmentin for 2 more days to complete course of treatment  3)HFpEF--- fluid balance is negative, weight is down as noted below, oxygen requirement is down, overall improving.Marland Kitchen He presented withacute on chronic preserved EF CHF exacerbation with hypoxia, echo with EF of 60 to 65% on 07/27/2018, troponins are flat -Patient appears to be euvolemic and has some renal intolerance -No further diuresis for now would recommend repeat BMP in 3 to 5 days and further diuresis at that point as needed -Monitor daily weights  4)AKi versus likely CKD progression ---last available creatinine is from 12/05/2016 it was 1.0, creatinine on admission was 2.6,... Creatinine is around 3 , continue to monitor closely ... unable to establish if patient has underlying CKD given lack of recent lab data....-Continue to holdHold losartan and chlorthalidone, hold Aldactone,  -Repeat BMP outpatient in 3 to 5 days  5)obesity/OSA----PTA and during hospitalization patient was not compliant with CPAP --- compliance with CPAP encouraged  6)HTN--- stable -Continue Coreg and amlodipine -New mother prior home medications if blood pressure stable and kidney function improved on repeat BMP  6)DM2--A1c 7.6, reflecting fair diabetic control,stopped glipizide, PTA patient was on Lantus 30 units nightly will restart same at this time  -Lantus increased to 34 units at bedtime -Prednisone for 5 more days and then discontinue.  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   Tape Other (See Comments)   Latex tape - pulls skin off.  NO latex allergy.      Medication List    STOP taking these medications   chlorthalidone 25 MG tablet Commonly known as: HYGROTON   Insulin Glargine 100 UNIT/ML Solostar Pen Commonly known as: Lantus SoloStar Replaced by: insulin glargine 100 UNIT/ML injection   losartan 100 MG tablet Commonly known as: COZAAR     TAKE these medications   albuterol 108 (90 Base) MCG/ACT inhaler Commonly known as: VENTOLIN HFA Inhale into the lungs.   alendronate 70 MG tablet Commonly known as: FOSAMAX Take 70 mg by mouth every Saturday. Take with a full glass of water on an empty stomach.   amLODipine 10 MG tablet Commonly known as: NORVASC Take 1 tablet (10 mg total) by mouth daily. Start taking on: August 02, 2018 What changed:   medication strength  how much to take   amoxicillin-clavulanate 500-125 MG tablet Commonly known as: AUGMENTIN Take 1 tablet (500 mg total) by mouth every 12 (twelve) hours for 2 days.   atorvastatin 40 MG tablet Commonly known as: LIPITOR Take 40 mg by mouth daily.   carvedilol 25 MG tablet Commonly known as: COREG Take 25 mg by mouth 2 (two) times daily with a meal.   clopidogrel 75 MG tablet Commonly known as: PLAVIX Take 1 tablet (75 mg total) by mouth daily.   feeding supplement (PRO-STAT SUGAR FREE 64) Liqd Take 60 mLs by mouth 2 (two) times daily.   gabapentin 300 MG capsule Commonly known as: NEURONTIN Take 1 capsule (300 mg total) by mouth 2 (two) times daily.   glipiZIDE 5 MG tablet Commonly known as: GLUCOTROL Take 5 mg by mouth daily  before breakfast.   guaiFENesin 600 MG 12 hr tablet Commonly known as: MUCINEX Take 1 tablet (600 mg total) by mouth 2 (two) times daily for 10 days.   Icosapent Ethyl 1 g Caps Take 1 capsule by mouth daily.   insulin glargine 100 UNIT/ML injection Commonly known as: LANTUS Inject 0.34 mLs (34 Units total) into the skin at bedtime. Replaces: Insulin Glargine 100 UNIT/ML Solostar Pen   latanoprost 0.005 % ophthalmic solution Commonly known as: XALATAN Place 1 drop into both eyes at bedtime.   loratadine 10 MG tablet Commonly known as: CLARITIN Take 10 mg daily by mouth.   multivitamin with minerals Tabs tablet Take 1 tablet by mouth daily.   pantoprazole 40 MG tablet Commonly known as: PROTONIX Take 40 mg daily by mouth.   polyethylene glycol 17 g packet Commonly known as: MIRALAX / GLYCOLAX Take 17 g by mouth daily as needed for mild constipation.   predniSONE 20 MG tablet Commonly known as: DELTASONE Take 1 tablet (20 mg total) by mouth daily with breakfast for 5 days. Start taking on: August 02, 2018   senna 8.6 MG Tabs tablet Commonly known as: SENOKOT Take 1 tablet (8.6 mg total) by mouth at bedtime.   triamcinolone ointment 0.1 % Commonly known as: KENALOG APPLY OINTMENT TOPICALLY TO AFFECTED AREA TWICE DAILY   Vitamin D (Ergocalciferol)  1.25 MG (50000 UT) Caps capsule Commonly known as: DRISDOL Take 50,000 Units by mouth every Saturday.       Contact information for follow-up providers    Barbie BannerWilson, Fred H, MD Follow up in 1 week(s).   Specialty: Family Medicine Contact information: [address] [place] [id number] [phone]            Contact information for after-discharge care    Destination    [facility] Preferred SNF .   Service: Skilled Nursing Contact information: [address] [place] [id number] [phone]                 Allergies  Allergen Reactions  . Aspirin      Jittery, nausea when take "full dose".  . Tape Other (See Comments)    Latex tape - pulls skin off.  NO latex allergy.    Consultations:  Pulmonology   Procedures/Studies: Dg Chest Port 1 View  Result Date: 07/31/2018 CLINICAL DATA:  Shortness of breath. EXAM: PORTABLE CHEST 1 VIEW COMPARISON:  Radiograph of July 30, 2018. FINDINGS: Stable cardiomegaly. Atherosclerosis of thoracic aorta is noted. No pneumothorax is noted. Slightly decreased bibasilar opacities are noted suggesting improving atelectasis or infiltrates. Bony thorax is unremarkable. IMPRESSION: Slightly improved bilateral lung opacities as described above. Aortic Atherosclerosis (ICD10-I70.0). Electronically Signed   By: Lupita RaiderJames  Green Jr M.D.   On: 07/31/2018 07:43   Dg Chest Port 1 View  Result Date: 07/30/2018 CLINICAL DATA:  Followup exam. Intubated patient. Respiratory failure. EXAM: PORTABLE CHEST 1 VIEW COMPARISON:  07/29/2018 and earlier studies. FINDINGS: Endotracheal tube and nasogastric tube are stable and well positioned. Bilateral vascular congestion with lower lung zone opacity consistent with a combination of pleural effusions and atelectasis. Findings stable from the most recent prior exam. Lung volumes remain low. IMPRESSION: 1. No significant change from the previous day's study. 2. Small bilateral pleural effusions with associated lung base opacity consistent with atelectasis and/or pneumonia. Central vascular congestion. 3. Stable well-positioned support apparatus. Electronically Signed   By: Amie Portlandavid  Ormond M.D.   On: 07/30/2018 08:53   Dg Chest Port 1 View  Result Date: 07/29/2018 CLINICAL DATA:  Respiratory failure. EXAM: PORTABLE CHEST 1 VIEW COMPARISON:  07/28/2018 FINDINGS: Lung volumes remain low. There is lung base opacity that is similar to the prior study, consistent with bilateral effusions with either atelectasis or infection. Endotracheal tube and nasal/orogastric tube are stable in well positioned.  IMPRESSION: 1. No significant change from the most recent prior study allowing for differences in lung volume and patient positioning. 2. Bilateral pleural effusions with persistent lung base opacities, the latter finding which may reflect atelectasis, pneumonia or a combination. 3. Stable support apparatus. Electronically Signed   By: Amie Portlandavid  Ormond M.D.   On: 07/29/2018 08:43   Dg Chest Port 1 View  Result Date: 07/28/2018 CLINICAL DATA:  Dyspnea. Respiratory abnormalities. Placement of endotracheal tube and nasogastric tube. EXAM: PORTABLE CHEST 1 VIEW COMPARISON:  07/27/2018 FINDINGS: Endotracheal tube is 2.6 cm above the carina. Nasogastric tube extends into the abdomen but the tip is beyond the image. Cardiac pad overlying the left chest. Increased densities at the left lung base suggestive for consolidation and/or pleural fluid. Increased hazy densities at the right lung base. Negative for pneumothorax. Heart size is upper limits of normal and stable. IMPRESSION: 1. Endotracheal tube is appropriately positioned. Nasogastric tube extends into the abdomen. 2. Increased basilar chest densities bilaterally. Findings could represent  a combination of consolidation and pleural fluid. Electronically Signed   By: Richarda OverlieAdam  Henn M.D.   On: 07/28/2018 08:44   Dg Chest Port 1 View  Result Date: 07/27/2018 CLINICAL DATA:  Shortness of breath and decreased oxygen saturation EXAM: PORTABLE CHEST 1 VIEW COMPARISON:  None. FINDINGS: There is cardiomegaly with pulmonary venous hypertension. There is interstitial edema with small pleural effusions. There is no frank airspace consolidation. No adenopathy. There is aortic atherosclerosis. No bone lesions. IMPRESSION: Pulmonary vascular congestion with small pleural effusions and interstitial edema. Suspect a degree of congestive heart failure. No consolidation. Aortic Atherosclerosis (ICD10-I70.0). Electronically Signed   By: Bretta BangWilliam  Woodruff III M.D.   On: 07/27/2018 08:47    Dg Abd Portable 1v  Result Date: 07/29/2018 CLINICAL DATA:  73 year old male nasogastric tube placement. EXAM: PORTABLE ABDOMEN - 1 VIEW COMPARISON:  KUB 05/07/2013. FINDINGS: Portable AP supine view at 2248 hours enteric tube tip is at the level of the distal body. Side hole is at the level of the midbody. Negative visible bowel gas pattern. Chronic degenerative and postoperative changes in the lumbar spine. Extensive chronic iliac artery calcified atherosclerosis. IMPRESSION: Enteric tube side hole at the gastric body. Electronically Signed   By: Odessa FlemingH  Hall M.D.   On: 07/29/2018 23:12     Discharge Exam: Vitals:   08/01/18 0501 08/01/18 0851  BP: 137/65   Pulse: 69   Resp: 18   Temp: 98.9 F (37.2 C)   SpO2: 93% 94%   Vitals:   08/01/18 0051 08/01/18 0500 08/01/18 0501 08/01/18 0851  BP:   137/65   Pulse:   69   Resp:   18   Temp:   98.9 F (37.2 C)   TempSrc:   Oral   SpO2: 93%  93% 94%  Weight:  104.3 kg    Height:        General: Pt is alert, awake, not in acute distress Cardiovascular: RRR, S1/S2 +, no rubs, no gallops Respiratory: CTA bilaterally, no wheezing, no rhonchi, on 3 L nasal cannula oxygen Abdominal: Soft, NT, ND, bowel sounds + Extremities: no edema, no cyanosis    The results of significant diagnostics from this hospitalization (including imaging, microbiology, ancillary and laboratory) are listed below for reference.     Microbiology: Recent Results (from the past 240 hour(s))  SARS Coronavirus 2 (CEPHEID - Performed in [facility]), Hosp Order     Status: None   Collection Time: 07/27/18  8:29 AM   Specimen: Nasopharyngeal Swab  Result Value Ref Range Status   SARS Coronavirus 2 NEGATIVE NEGATIVE Final    Comment: (NOTE) If result is NEGATIVE SARS-CoV-2 target nucleic acids are NOT DETECTED. The SARS-CoV-2 RNA is generally detectable in upper and lower  respiratory specimens during the acute phase of infection. The lowest   concentration of SARS-CoV-2 viral copies this assay can detect is 250  copies / mL. A negative result does not preclude SARS-CoV-2 infection  and should not be used as the sole basis for treatment or other  patient management decisions.  A negative result may occur with  improper specimen collection / handling, submission of specimen other  than nasopharyngeal swab, presence of viral mutation(s) within the  areas targeted by this assay, and inadequate number of viral copies  (<250 copies / mL). A negative result must be combined with clinical  observations, patient history, and epidemiological information. If result is POSITIVE SARS-CoV-2 target nucleic acids are DETECTED. The SARS-CoV-2 RNA is generally  detectable in upper and lower  respiratory specimens dur ing the acute phase of infection.  Positive  results are indicative of active infection with SARS-CoV-2.  Clinical  correlation with patient history and other diagnostic information is  necessary to determine patient infection status.  Positive results do  not rule out bacterial infection or co-infection with other viruses. If result is PRESUMPTIVE POSTIVE SARS-CoV-2 nucleic acids MAY BE PRESENT.   A presumptive positive result was obtained on the submitted specimen  and confirmed on repeat testing.  While 2019 novel coronavirus  (SARS-CoV-2) nucleic acids may be present in the submitted sample  additional confirmatory testing may be necessary for epidemiological  and / or clinical management purposes  to differentiate between  SARS-CoV-2 and other Sarbecovirus currently known to infect humans.  If clinically indicated additional testing with an alternate test  methodology [phone]) is advised. The SARS-CoV-2 RNA is generally  detectable in upper and lower respiratory sp ecimens during the acute  phase of infection. The expected result is Negative. Fact Sheet for Patients:  BoilerBrush.com.cy Fact Sheet  for Healthcare Providers: https://pope.com/ This test is not yet approved or cleared by the [place] FDA and has been authorized for detection and/or diagnosis of SARS-CoV-2 by FDA under an Emergency Use Authorization (EUA).  This EUA will remain in effect (meaning this test can be used) for the duration of the COVID-19 declaration under Section 564(b)(1) of the Act, 21 U.S.C. section 360bbb-3(b)(1), unless the authorization is terminated or revoked sooner. Performed at [facility], [address] [place], [place] [address]   MRSA PCR Screening     Status: None   Collection Time: 07/28/18  6:54 AM   Specimen: Nasal Mucosa; Nasopharyngeal  Result Value Ref Range Status   MRSA by PCR NEGATIVE NEGATIVE Final    Comment:        The GeneXpert MRSA Assay (FDA approved for NASAL specimens only), is one component of a comprehensive MRSA colonization surveillance program. It is not intended to diagnose MRSA infection nor to guide or monitor treatment for MRSA infections. Performed at [facility], [address] [place], [place] [address]   Culture, blood (Routine X 2) w Reflex to ID Panel     Status: None (Preliminary result)   Collection Time: 07/28/18  8:34 AM   Specimen: BLOOD LEFT HAND  Result Value Ref Range Status   Specimen Description BLOOD LEFT HAND Blood Culture adequate volume  Final   Special Requests BOTTLES DRAWN AEROBIC AND ANAEROBIC  Final   Culture   Final    NO GROWTH 4 DAYS Performed at [facility], [address] [place], [place] [address]    Report Status PENDING  Incomplete  Culture, blood (Routine X 2) w Reflex to ID Panel     Status: None (Preliminary result)   Collection Time: 07/28/18  6:42 PM   Specimen: BLOOD LEFT ARM  Result Value Ref Range Status   Specimen Description BLOOD LEFT ARM  Final   Special Requests   Final    BOTTLES DRAWN AEROBIC ONLY Blood Culture adequate volume   Culture   Final    NO GROWTH 4  DAYS Performed at [facility], [address] [place], [place] [address]    Report Status PENDING  Incomplete  Culture, respiratory     Status: None   Collection Time: 07/29/18  1:23 AM   Specimen: SPU  Result Value Ref Range Status   Specimen Description   Final    SPUTUM  Performed at [facility], [address], [place], [place] [address]    Special Requests   Final    NONE Performed at [facility], [address] [place], [place] [address]    Gram Stain   Final    RARE WBC PRESENT, PREDOMINANTLY PMN RARE GRAM POSITIVE COCCI    Culture   Final    FEW Consistent with normal respiratory flora. Performed at [facility], [address] [place], [place] [address]    Report Status 07/31/2018 FINAL  Final     Labs: BNP (last 3 results) Recent Labs    07/27/18 0830  BNP 348.0*   Basic Metabolic Panel: Recent Labs  Lab 07/28/18 0545 07/28/18 0642 07/29/18 0339 07/29/18 0706 07/29/18 1631 07/30/18 0416 08/01/18 0519  NA 144 143 144  --   --  142 141  K 5.2* 5.5* 3.5  --   --  3.4* 3.5  CL 110 110 109  --   --  103 101  CO2 --   --  24 28  GLUCOSE 224* 238* 258*  --   --  379* 262*  BUN 65* 63* 71*  --   --  77* 99*  CREATININE 3.11* 3.31* 3.02*  --   --  2.95* 3.35*  CALCIUM 8.5* 8.5* 8.4*  --   --  8.7* 8.0*  MG  --  2.5*  --  2.4 2.2 2.2  --   PHOS  --   --   --  4.4 4.5 5.6*  --    Liver Function Tests: Recent Labs  Lab 07/28/18 0642 07/29/18 0339  AST 15 13*  ALT 20 17  ALKPHOS 77 58  BILITOT 0.3 0.4  PROT 6.6 5.3*  ALBUMIN 3.2* 2.6*   No results for input(s): LIPASE, AMYLASE in the last 168 hours. No results for input(s): AMMONIA in the last 168 hours. CBC: Recent Labs  Lab 07/27/18 0830 07/28/18 0545 07/29/18 0339  WBC 10.0 12.6* 10.4  NEUTROABS 7.7  --   --   HGB 10.5* 10.5* 9.7*  HCT 36.3* 37.9* 33.5*  MCV 98.4 103.0* 96.0  PLT 195 195 191   Cardiac Enzymes: No results for input(s): CKTOTAL, CKMB, CKMBINDEX,  TROPONINI in the last 168 hours. BNP: Invalid input(s): POCBNP CBG: Recent Labs  Lab 07/31/18 1131 07/31/18 1627 07/31/18 2155 08/01/18 0730 08/01/18 1119  GLUCAP 398* 371* 372* 278* 333*   D-Dimer No results for input(s): DDIMER in the last 72 hours. Hgb A1c No results for input(s): HGBA1C in the last 72 hours. Lipid Profile Recent Labs    07/30/18 0416  TRIG 298*   Thyroid function studies No results for input(s): TSH, T4TOTAL, T3FREE, THYROIDAB in the last 72 hours.  Invalid input(s): FREET3 Anemia work up No results for input(s): VITAMINB12, FOLATE, FERRITIN, TIBC, IRON, RETICCTPCT in the last 72 hours. Urinalysis    Component Value Date/Time   COLORURINE YELLOW 07/27/2018 1929   APPEARANCEUR HAZY (A) 07/27/2018 1929   LABSPEC 1.012 07/27/2018 1929   PHURINE 5.0 07/27/2018 1929   GLUCOSEU 150 (A) 07/27/2018 1929   HGBUR NEGATIVE 07/27/2018 1929   BILIRUBINUR NEGATIVE 07/27/2018 1929   KETONESUR NEGATIVE 07/27/2018 1929   PROTEINUR >=300 (A) 07/27/2018 1929   NITRITE NEGATIVE 07/27/2018 1929   LEUKOCYTESUR NEGATIVE 07/27/2018 1929   Sepsis Labs Invalid input(s): PROCALCITONIN,  WBC,  LACTICIDVEN Microbiology Recent Results (from the past 240 hour(s))  SARS Coronavirus 2 (CEPHEID - Performed in [facility] hospital lab), Hosp Order     Status: None   Collection Time: 07/27/18  8:29 AM   Specimen: Nasopharyngeal Swab  Result Value Ref Range Status   SARS Coronavirus 2 NEGATIVE NEGATIVE Final    Comment: (NOTE) If result is NEGATIVE SARS-CoV-2 target nucleic acids are NOT DETECTED. The SARS-CoV-2 RNA is generally detectable in upper and lower  respiratory specimens during the acute phase of infection. The lowest  concentration of SARS-CoV-2 viral copies this assay can detect is 250  copies / mL. A negative result does not preclude SARS-CoV-2 infection  and should not be used as the sole basis for treatment or other  patient management decisions.  A  negative result may occur with  improper specimen collection / handling, submission of specimen other  than nasopharyngeal swab, presence of viral mutation(s) within the  areas targeted by this assay, and inadequate number of viral copies  (<250 copies / mL). A negative result must be combined with clinical  observations, patient history, and epidemiological information. If result is POSITIVE SARS-CoV-2 target nucleic acids are DETECTED. The SARS-CoV-2 RNA is generally detectable in upper and lower  respiratory specimens dur ing the acute phase of infection.  Positive  results are indicative of active infection with SARS-CoV-2.  Clinical  correlation with patient history and other diagnostic information is  necessary to determine patient infection status.  Positive results do  not rule out bacterial infection or co-infection with other viruses. If result is PRESUMPTIVE POSTIVE SARS-CoV-2 nucleic acids MAY BE PRESENT.   A presumptive positive result was obtained on the submitted specimen  and confirmed on repeat testing.  While 2019 novel coronavirus  (SARS-CoV-2) nucleic acids may be present in the submitted sample  additional confirmatory testing may be necessary for epidemiological  and / or clinical management purposes  to differentiate between  SARS-CoV-2 and other Sarbecovirus currently known to infect humans.  If clinically indicated additional testing with an alternate test  methodology [phone]) is advised. The SARS-CoV-2 RNA is generally  detectable in upper and lower respiratory sp ecimens during the acute  phase of infection. The expected result is Negative. Fact Sheet for Patients:  BoilerBrush.com.cy Fact Sheet for Healthcare Providers: https://pope.com/ This test is not yet approved or cleared by the [place] FDA and has been authorized for detection and/or diagnosis of SARS-CoV-2 by FDA under an Emergency Use  Authorization (EUA).  This EUA will remain in effect (meaning this test can be used) for the duration of the COVID-19 declaration under Section 564(b)(1) of the Act, 21 U.S.C. section 360bbb-3(b)(1), unless the authorization is terminated or revoked sooner. Performed at [facility], [address], [place], [place] [address]   MRSA PCR Screening     Status: None   Collection Time: 07/28/18  6:54 AM   Specimen: Nasal Mucosa; Nasopharyngeal  Result Value Ref Range Status   MRSA by PCR NEGATIVE NEGATIVE Final    Comment:        The GeneXpert MRSA Assay (FDA approved for NASAL specimens only), is one component of a comprehensive MRSA colonization surveillance program. It is not intended to diagnose MRSA infection nor to guide or monitor treatment for MRSA infections. Performed at [facility], [address] [address], [place], [place] [address]   Culture, blood (Routine X 2) w Reflex to ID Panel     Status: None (Preliminary result)   Collection Time: 07/28/18  8:34 AM   Specimen: BLOOD LEFT HAND  Result Value Ref Range Status  Specimen Description BLOOD LEFT HAND Blood Culture adequate volume  Final   Special Requests BOTTLES DRAWN AEROBIC AND ANAEROBIC  Final   Culture   Final    NO GROWTH 4 DAYS Performed at [facility], [address] [place], [place] [address]    Report Status PENDING  Incomplete  Culture, blood (Routine X 2) w Reflex to ID Panel     Status: None (Preliminary result)   Collection Time: 07/28/18  6:42 PM   Specimen: BLOOD LEFT ARM  Result Value Ref Range Status   Specimen Description BLOOD LEFT ARM  Final   Special Requests   Final    BOTTLES DRAWN AEROBIC ONLY Blood Culture adequate volume   Culture   Final    NO GROWTH 4 DAYS Performed at [facility], [address] [place], [place] [address]    Report Status PENDING  Incomplete  Culture, respiratory     Status: None   Collection Time: 07/29/18  1:23 AM   Specimen: SPU  Result Value Ref Range Status    Specimen Description   Final    SPUTUM Performed at [facility], [address] [place], [place] [address]    Special Requests   Final    NONE Performed at [facility], [address] [place], [place] [address]    Gram Stain   Final    RARE WBC PRESENT, PREDOMINANTLY PMN RARE GRAM POSITIVE COCCI    Culture   Final    FEW Consistent with normal respiratory flora. Performed at [facility], [address] [place], [place] [address]    Report Status 07/31/2018 FINAL  Final     Time coordinating discharge: 35 minutes  SIGNED:   Erick BlinksPratik D Rilei Kravitz, DO [facility] 08/01/2018, 12:45 PM  If 7PM-7AM, please contact night-coverage [email or web address] Password [email or web address]

## 2018-08-01 NOTE — Care Management Important Message (Signed)
Important Message  Patient Details  Name: Carl Miller MRN: [id number] Date of Birth: 1945-08-24   Medicare Important Message Given:  Yes     Tommy Medal 08/01/2018, 11:16 AM

## 2018-08-01 NOTE — Progress Notes (Addendum)
Physical Therapy Treatment Patient Details Name: Carl Miller MRN: [id number] DOB: 12-Oct-1945 Today's Date: 08/01/2018    History of Present Illness Carl Miller  is a 73 y.o. male presents to the ED with complaints of increasing lower extremity edema, about 20 pound weight gain, worsening shortness of breath,  orthopnea and abdominal swelling-over the last 2 to 3 weeks particularly worse over the last 2 days    PT Comments    Pt agreeable to therapy this morning.  Able to transfer to supine to EOB without physical assist, however use of bedrails, increased time and verbal cues for using UE correctly.  PT with little assist to stand, however very unstable upon initially standing and eager to begin ambulation before establishing his balance.  PT with extension posturing and cues to shift weight forward.  PT ambulated 2 bouts of 10 feet with approx 6 minute rest between to level out O2 sats.  Sats dropped below 88% both times, as low as 79% before resting.   Pt required little cues for proper breathing to re-establish O2 levels to normal.  PT completed ambulation with use of RW and 3 LO2.  LE strengthening exercises completed upon return to seated position.     Follow Up Recommendations        Equipment Recommendations       Recommendations for Other Services       Precautions / Restrictions Precautions Precautions: Fall Restrictions Weight Bearing Restrictions: No    Mobility  Bed Mobility Overal bed mobility: Needs Assistance Bed Mobility: Supine to Sit     Supine to sit: Min guard     General bed mobility comments: slow labored movement  Transfers Overall transfer level: Needs assistance     Sit to Stand: Mod assist            Ambulation/Gait   Gait Distance (Feet): 20 Feet(2 bouts of 10 feet due to O2 sats dropping) Assistive device: Rolling walker (2 wheeled) Gait Pattern/deviations: Decreased step length - right;Decreased step length - left;Decreased  stride length Gait velocity: slow   General Gait Details: limited to 10 feet with rest, 10 feet more due to BLE weakness and fair/poor coordination of legs, on 3 LPM with SpO2 dropping from 95% to below 88%   Stairs             Wheelchair Mobility    Modified Rankin (Stroke Patients Only)       Balance Overall balance assessment: Needs assistance Sitting-balance support: Feet supported;No upper extremity supported                                        Cognition Arousal/Alertness: Awake/alert Behavior During Therapy: WFL for tasks assessed/performed Overall Cognitive Status: Within Functional Limits for tasks assessed                                        Exercises General Exercises - Lower Extremity Long Arc Quad: AROM;Strengthening;Right;Left;10 reps;Seated Hip Flexion/Marching: AROM;Strengthening;Right;Left;10 reps;Seated    General Comments        Pertinent Vitals/Pain Pain Assessment: No/denies pain    Home Living                      Prior Function  PT Goals (current goals can now be found in the care plan section)      Frequency    Min 3X/week      PT Plan  Continue with POC    Co-evaluation              AM-PAC PT "6 Clicks" Mobility   Outcome Measure  Help needed turning from your back to your side while in a flat bed without using bedrails?: A Little Help needed moving from lying on your back to sitting on the side of a flat bed without using bedrails?: A Little Help needed moving to and from a bed to a chair (including a wheelchair)?: A Lot Help needed standing up from a chair using your arms (e.g., wheelchair or bedside chair)?: A Lot Help needed to walk in hospital room?: A Lot Help needed climbing 3-5 steps with a railing? : Total 6 Click Score: 13    End of Session Equipment Utilized During Treatment: Gait belt;Oxygen Activity Tolerance: Patient limited by  fatigue Patient left: in chair;with call bell/phone within reach Nurse Communication: Mobility status PT Visit Diagnosis: Unsteadiness on feet (R26.81);Other abnormalities of gait and mobility (R26.89);Muscle weakness (generalized) (M62.81)     Time: 0950-1030 PT Time Calculation (min) (ACUTE ONLY): 40 min  Charges:  $Gait Training: 8-22 mins $Therapeutic Activity: 8-22 mins                     Lurena NidaAmy B Zaiya Annunziato, PTA/CLT [phone]    Bascom LevelsFrazier, Qamar Rosman B 08/01/2018, 11:44 AM

## 2018-08-01 NOTE — TOC Transition Note (Signed)
Transition of Care [facility]) - CM/SW Discharge Note   Patient Details  Name: BEVIN MAYALL MRN: [id number] Date of Birth: Apr 04, 1945  [facility]) CM/SW Contact:  Shade Flood, LCSW Phone Number: 08/01/2018, 12:48 PM   Clinical Narrative:     Pt stable for dc today per MD. Damaris Schooner with pt to update on bed offers. He requested additional referral to [facility] and he has been offered a bed there as well. Updated pt who states that he wants to go to [facility]. They can accept him today with his current COVID test results per Talbert Forest at [facility].   Updated RN who will call report. EMS scheduled for 1400. HIPPA compliant voicemail message left for pt's daughter to update.   DC clinical will be sent electronically. There are no other TOC needs for dc.  Final next level of care: [place] Barriers to Discharge: Barriers Resolved   Patient Goals and CMS Choice        Discharge Placement PASRR number recieved: 07/31/18            Patient chooses bed at: [facility] Patient to be transferred to facility by: EMS Name of family member notified: Rollene Fare Patient and family notified of of transfer: 08/01/18  Discharge Plan and Services                                     Social Determinants of Health ([place]) Interventions     Readmission Risk Interventions Readmission Risk Prevention Plan 08/01/2018  PCP or Specialist Appt within 5-7 Days Not Complete  Not Complete comments Pt discharging to SNF. SNF MD will follow up  [place] Not Complete  Home Care Screening Not Completed Comments Pt discharging to SNF  Medication Review (RN CM) Complete  Some recent data might be hidden

## 2018-08-01 NOTE — Progress Notes (Signed)
PT DISCHARGED TO [facility],R/T [place]. IVS REMOVED, ANGIOS INTACT, PT DENIES C/O PAIN, BELONGINGS IN PLACE, VERBALIZED UNDERSTANDING OF ALL INSTRUCTIONS PROVIDED. LEFT FLOOR VIA STRETCHER ACCOMPANIED BY EMS STAFF.REPORT CALLED TO STAFF AT [place], SPOKE WITH JOY COX RN.

## 2018-08-01 NOTE — Care Management Important Message (Signed)
Important Message  Patient Details  Name: Carl Miller MRN: [id number] Date of Birth: [birth year]   Medicare Important Message Given:  Yes     Tommy Medal 08/01/2018, 2:23 PM

## 2018-08-01 NOTE — Progress Notes (Signed)
Inpatient Diabetes Program Recommendations  AACE/ADA: New Consensus Statement on Inpatient Glycemic Control   Target Ranges:  Prepandial:   less than 140 mg/dL      Peak postprandial:   less than 180 mg/dL (1-2 hours)      Critically ill patients:  140 - 180 mg/dL   Results for WINFREY, CHILLEMI (MRN [id number]) as of 08/01/2018 11:43  Ref. Range 07/31/2018 07:14 07/31/2018 11:31 07/31/2018 16:27 07/31/2018 21:55 08/01/2018 07:30 08/01/2018 11:19  Glucose-Capillary Latest Ref Range: 70 - 99 mg/dL 306 (H) 398 (H) 371 (H) 372 (H) 278 (H) 333 (H)   Review of Glycemic Control  Diabetes history:DM2 Outpatient Diabetes medications:Lantus 30 units QHS, Glipizide 5 mg QAM Current orders for Inpatient glycemic control:Lantus 30 units QHS, Novolog 0-15 units TID, Novolog 0-5 units QHS; Prednisone 20 mg QAM  Inpatient Diabetes Program Recommendations:  Insulin-Basal: If steroids are continued, please consider increasing Lantus to 34 units QHS.  Insulin-Meal Coverage: Please consider ordering Novolog 4 units TID with meals for meal coverage if patient eats at least 50% of meals.  Thanks, Barnie Alderman, RN, MSN, CDE Diabetes Coordinator Inpatient Diabetes Program [phone] (Team Pager from 8am to 5pm)

## 2018-08-01 NOTE — Progress Notes (Signed)
He was able to be extubated on the 13th and has done well.  He has sleep apnea and has been noncompliant with CPAP but would do much better if he would use CPAP.  He is apparently going to skilled care facility placement so hopefully he will use CPAP there.  That would likely improve his heart failure as well.  Since he has improved and is awaiting skilled care facility placement I will plan to sign off.  Thanks for allowing me to see him with you

## 2018-08-02 LAB — CULTURE, BLOOD (ROUTINE X 2)
Culture: NO GROWTH
Culture: NO GROWTH
Special Requests: ADEQUATE
Specimen Description: ADEQUATE

## 2018-08-03 ENCOUNTER — Emergency Department (HOSPITAL_COMMUNITY)
Admission: EM | Admit: 2018-08-03 | Discharge: 2018-08-18 | Disposition: E | Payer: Medicare Other | Attending: Emergency Medicine | Admitting: Emergency Medicine

## 2018-08-03 DIAGNOSIS — I469 Cardiac arrest, cause unspecified: Secondary | ICD-10-CM | POA: Diagnosis not present

## 2018-08-03 DIAGNOSIS — Z79899 Other long term (current) drug therapy: Secondary | ICD-10-CM | POA: Insufficient documentation

## 2018-08-03 DIAGNOSIS — Z87891 Personal history of nicotine dependence: Secondary | ICD-10-CM | POA: Diagnosis not present

## 2018-08-03 DIAGNOSIS — Z7902 Long term (current) use of antithrombotics/antiplatelets: Secondary | ICD-10-CM | POA: Insufficient documentation

## 2018-08-03 DIAGNOSIS — I5032 Chronic diastolic (congestive) heart failure: Secondary | ICD-10-CM | POA: Diagnosis not present

## 2018-08-03 DIAGNOSIS — E119 Type 2 diabetes mellitus without complications: Secondary | ICD-10-CM | POA: Insufficient documentation

## 2018-08-03 DIAGNOSIS — I110 Hypertensive heart disease with heart failure: Secondary | ICD-10-CM | POA: Insufficient documentation

## 2018-08-03 DIAGNOSIS — Z8673 Personal history of transient ischemic attack (TIA), and cerebral infarction without residual deficits: Secondary | ICD-10-CM | POA: Diagnosis not present

## 2018-08-03 LAB — CBG MONITORING, ED: Glucose-Capillary: 430 mg/dL — ABNORMAL HIGH (ref 70–99)

## 2018-08-03 MED ORDER — CALCIUM CHLORIDE 10 % IV SOLN
INTRAVENOUS | Status: AC | PRN
  Administered 2018-08-03: 1 g via INTRAVENOUS

## 2018-08-03 MED ORDER — SODIUM BICARBONATE 8.4 % IV SOLN
INTRAVENOUS | Status: DC | PRN
  Administered 2018-08-03: 50 meq via INTRAVENOUS

## 2018-08-03 MED ORDER — ATROPINE SULFATE 1 MG/ML IJ SOLN
INTRAMUSCULAR | Status: AC | PRN
  Administered 2018-08-03 (×2): .5 mg via INTRAVENOUS

## 2018-08-03 MED FILL — Medication: Qty: 1 | Status: AC

## 2018-08-18 NOTE — Code Documentation (Signed)
Pulse check- PEA

## 2018-08-18 NOTE — Code Documentation (Signed)
Pulse check: faint carotid pulse present

## 2018-08-18 NOTE — ED Provider Notes (Signed)
[facility] EMERGENCY DEPARTMENT Provider Note   CSN: [id number] Arrival date & time: 07-Sep-2018  [id number]   LEVEL 5 CAVEAT - CPR IN PROGRESS  History   Chief Complaint Chief Complaint  Patient presents with  . cpr    HPI Carl Miller is a 73 y.o. male.     HPI  73 year old male presents in cardiac arrest.  EMS presents the history.  6:45 AM he was given insulin for glucose over 500.  At 7 when rechecked he was pulseless and apneic and that is when EMS started CPR.  CPR has been on and off since.  The patient has briefly regained pulses but it will not last.  Has been given 11 epinephrines.  Glucose has been repeatedly in the 400s for EMS.  At one point they tried pacing him with no significant effect.  First rhythm was asystole, since has had PEA.  Past Medical History:  Diagnosis Date  . Diabetes mellitus (HCC)   . 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 GERD (gastroesophageal reflux disease)   . Headache   . Hyperlipidemia   . Hypertension   . Palpitations   . Stroke (HCC)   . Vision abnormalities   . Worsening headaches 01/28/2014    Patient Active Problem List   Diagnosis Date Noted  . Acute exacerbation of CHF (congestive heart failure)/HFpEF 07/27/2018  . Acute Respiratory failure with hypoxia (HCC) 07/27/2018  . Acute on chronic diastolic CHF (congestive heart failure) /HFpEF 07/27/2018  . Pain due to onychomycosis of toenails of both feet 07/13/2018  . Intracranial vascular stenosis 05/18/2017  . TIA (transient ischemic attack) 03/07/2017  . Type 2 diabetes mellitus with hyperglycemia (HCC) 09/16/2016  . Arthritis 05/05/2015  . 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 Generalized muscle weakness 05/05/2015  . GERD (gastroesophageal reflux disease) 05/05/2015  . Glaucoma 05/05/2015  . Hearing loss 05/05/2015  . Hypertension 05/05/2015  . Migraine headache 05/05/2015  . Situational  depression 05/05/2015  . Venous stasis 05/05/2015  . Hyperlipidemia 11/17/2014  . Essential (primary) hypertension 11/17/2014  . Venous intermittent claudication   . Bilateral leg edema   . OSA (obstructive sleep apnea) 05/23/2014  . Hypoxemia 05/23/2014  . Worsening headaches 01/28/2014  . Type 2 diabetes mellitus with hyperglycemia, with long-term current use of insulin (HCC) 01/28/2014  . Snoring 01/28/2014  . Cluster headache 01/28/2014  . Nasal congestion with rhinorrhea 01/28/2014  . 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 Abnormal nuclear stress test 12/16/2013    Past Surgical History:  Procedure Laterality Date  . BACK SURGERY    . DENTAL SURGERY    . LEFT HEART CATHETERIZATION WITH CORONARY ANGIOGRAM N/A 01/13/2014   Procedure: LEFT HEART CATHETERIZATION WITH CORONARY ANGIOGRAM;  Surgeon: Pamella PertJagadeesh R Ganji, MD;  [facility];  Service: Cardiovascular;  Laterality: N/A;  . lithotomy    . SHOULDER SURGERY          Home Medications    Prior to Admission medications   Medication Sig Start Date End Date Taking? Authorizing Provider  albuterol (PROVENTIL HFA;VENTOLIN HFA) 108 (90 Base) MCG/ACT inhaler Inhale into the lungs. 10/13/16   [provider]  alendronate (FOSAMAX) 70 MG tablet Take 70 mg by mouth every Saturday. Take with a full glass of water on an empty stomach.    [provider]  Amino Acids-Protein Hydrolys (FEEDING SUPPLEMENT,  PRO-STAT SUGAR FREE 64,) LIQD Take 60 mLs by mouth 2 (two) times daily. 08/01/18   Sherryll BurgerShah, Pratik D, DO  amLODipine (NORVASC) 10 MG tablet Take 1 tablet (10 mg total) by mouth daily. 08/02/18 09/01/18  Sherryll BurgerShah, Pratik D, DO  amoxicillin-clavulanate (AUGMENTIN) 500-125 MG tablet Take 1 tablet (500 mg total) by mouth every 12 (twelve) hours for 2 days. 08/01/18 02-Oct-2018  Sherryll BurgerShah, Pratik D, DO  atorvastatin (LIPITOR) 40 MG tablet Take 40 mg by mouth  daily.    [provider]  carvedilol (COREG) 25 MG tablet Take 25 mg by mouth 2 (two) times daily with a meal.    [provider]  clopidogrel (PLAVIX) 75 MG tablet Take 1 tablet (75 mg total) by mouth daily. 03/07/17   Marvel PlanXu, [name], MD  gabapentin (NEURONTIN) 300 MG capsule Take 1 capsule (300 mg total) by mouth 2 (two) times daily. 08/14/17   George HughVanschaick, Jessica, NP  glipiZIDE (GLUCOTROL) 5 MG tablet Take 5 mg by mouth daily before breakfast.     [provider]  guaiFENesin (MUCINEX) 600 MG 12 hr tablet Take 1 tablet (600 mg total) by mouth 2 (two) times daily for 10 days. 08/01/18 08/11/18  Sherryll BurgerShah, Pratik D, DO  Icosapent Ethyl 1 g CAPS Take 1 capsule by mouth daily.     [provider]  insulin glargine (LANTUS) 100 UNIT/ML injection Inject 0.34 mLs (34 Units total) into the skin at bedtime. 08/01/18   Sherryll BurgerShah, Pratik D, DO  latanoprost (XALATAN) 0.005 % ophthalmic solution Place 1 drop into both eyes at bedtime.    [provider]  loratadine (CLARITIN) 10 MG tablet Take 10 mg daily by mouth.    [provider]  Multiple Vitamin (MULTIVITAMIN WITH MINERALS) TABS tablet Take 1 tablet by mouth daily.    [provider]  pantoprazole (PROTONIX) 40 MG tablet Take 40 mg daily by mouth.  01/02/14   [provider]  polyethylene glycol (MIRALAX / GLYCOLAX) 17 g packet Take 17 g by mouth daily as needed for mild constipation. 08/01/18   Sherryll BurgerShah, Pratik D, DO  predniSONE (DELTASONE) 20 MG tablet Take 1 tablet (20 mg total) by mouth daily with breakfast for 5 days. 08/02/18 08/07/18  Sherryll BurgerShah, Pratik D, DO  senna (SENOKOT) 8.6 MG TABS tablet Take 1 tablet (8.6 mg total) by mouth at bedtime. 08/01/18   Sherryll BurgerShah, Pratik D, DO  triamcinolone ointment (KENALOG) 0.1 % APPLY OINTMENT TOPICALLY TO AFFECTED AREA TWICE DAILY 07/09/18   [provider]  Vitamin D, Ergocalciferol, (DRISDOL) 50000 UNITS CAPS capsule Take 50,000 Units by mouth every Saturday.     [provider]    Family History Family History  Problem Relation Age of Onset  . Stroke Mother   . Diabetes Mother   . Heart attack Father     Social History Social History   Tobacco Use  . Smoking status: Former Smoker    Quit date: 01/18/1997    Years since quitting: 21.5  . Smokeless tobacco: Former NeurosurgeonUser    Quit date: 01/17/1997  Substance Use Topics  . Alcohol use: No    Alcohol/week: 0.0 standard drinks    Comment: quit 12/2012  . Drug use: No     Allergies   Aspirin and Tape   Review of Systems Review of Systems  Unable to perform ROS: Patient unresponsive     Physical Exam Updated Vital Signs There were no vitals taken for this visit.  Physical Exam Vitals signs and nursing  note reviewed.  Constitutional:      Appearance: He is well-developed.     Interventions: He is intubated.  HENT:     Head: Normocephalic and atraumatic.     Right Ear: External ear normal.     Left Ear: External ear normal.     Nose: Nose normal.  Eyes:     General:        Right eye: No discharge.        Left eye: No discharge.  Neck:     Musculoskeletal: Neck supple.  Pulmonary:     Effort: He is intubated.     Comments: King Airway Abdominal:     General: There is no distension.  Skin:    General: Skin is dry.  Neurological:     Mental Status: He is unresponsive.  Psychiatric:        Mood and Affect: Mood is not anxious.      ED Treatments / Results  Labs (all labs ordered are listed, but only abnormal results are displayed) Labs Reviewed  CBG MONITORING, ED - Abnormal; Notable for the following components:      Result Value   Glucose-Capillary 430 (*)    All other components within normal limits    EKG None  Radiology No results found.  Procedures Procedures (including critical care time)  Cardiopulmonary Resuscitation (CPR) Procedure Note Directed/Performed by: Ephraim Hamburger I personally directed ancillary staff and/or performed CPR  in an effort to regain return of spontaneous circulation and to maintain cardiac, neuro and systemic perfusion.    Medications Ordered in ED Medications  sodium bicarbonate injection (50 mEq Intravenous Given 2018/08/04 0824)  atropine injection (0.5 mg Intravenous Given 08/04/18 0822)  calcium chloride injection (1 g Intravenous Given Aug 04, 2018 0823)     Initial Impression / Assessment and Plan / ED Course  I have reviewed the triage vital signs and the nursing notes.  Pertinent labs & imaging results that were available during my care of the patient were reviewed by me and considered in my medical decision making (see chart for details).        Patient has been in CPR for most of the better part of an hour.  With the degree of epinephrine she is been given, his overall premorbid condition, I think the odds of good outcome are extremely low.  He was given meds for CPR including atropine, calcium, and bicarb with no relief.  Bedside echocardiogram shows minimal to no activity and he is in PEA.  Given the likely futility of ongoing CPR, the code was called at 829.  I informed his son in person.  Final Clinical Impressions(s) / ED Diagnoses   Final diagnoses:  None    ED Discharge Orders    None       Sherwood Gambler, MD 08/04/2018 [phone]

## 2018-08-18 NOTE — ED Triage Notes (Addendum)
Pt arrives via [facility] from [facility]. Pt was seen by facility staff around [phone] when he was given his insulin this morning. Upon staff returning approx 15 mins later, pt was unresponsive, apneic and pulseless. Asystole upon EMS arrival to facility. Pt received 11 epis and 1589ml ns via EMS. Cardiac rhythm ranged between asystole to pea while with EMS. Capnography 60-70 en route. faint carotid pulse present upon arrival. cbg 492. King airway in place, ventilations being assisted and CPR in progress upon arrival.

## 2018-08-18 NOTE — Progress Notes (Signed)
Responded to ED page to support patient son. Patient came to ED as CPR and later passed away. Son was escorted to bedside to spend time with father. Son did not know funeral home information. I gave son patient placement card with instruction on how to proceed. I gave nurse update and supported staff.  Jaclynn Major, [place], [facility], Pager [phone]

## 2018-08-18 NOTE — ED Notes (Signed)
Dr. Regenia Skeeter advised patient will not be an ME case, and tubes and lines may be removed at this time.

## 2018-08-18 NOTE — Code Documentation (Signed)
Patient time of death occurred at [phone]

## 2018-08-18 NOTE — Code Documentation (Signed)
Pulse check asystole

## 2018-08-18 NOTE — ED Notes (Signed)
Patient placement made aware that patient is ready to be transported to the morgue. Shawn in pt placement advised the funeral home of choice has been notified and patient can be transported to morgue at this time

## 2018-08-18 NOTE — ED Notes (Signed)
Ray, hospital Chaplain with patient's son at bedside

## 2018-08-18 NOTE — ED Notes (Signed)
Patient's son in consultation room notified of patient's passing by Dr. Regenia Skeeter. Secretary to page chaplain for family support

## 2018-08-18 DEATH — deceased

## 2018-10-12 ENCOUNTER — Ambulatory Visit: Payer: Medicare Other | Admitting: Podiatry

## 2021-04-27 IMAGING — CR PORTABLE CHEST - 1 VIEW
1 series · 2 of 2 positions shown · non-contrast
Comparison: None.

CLINICAL DATA: Shortness of breath and decreased oxygen saturation

EXAM:
PORTABLE CHEST 1 VIEW

[Series 1: portable · 0.17mm/px · 2 of 2 slices shown]
[im 1/2]
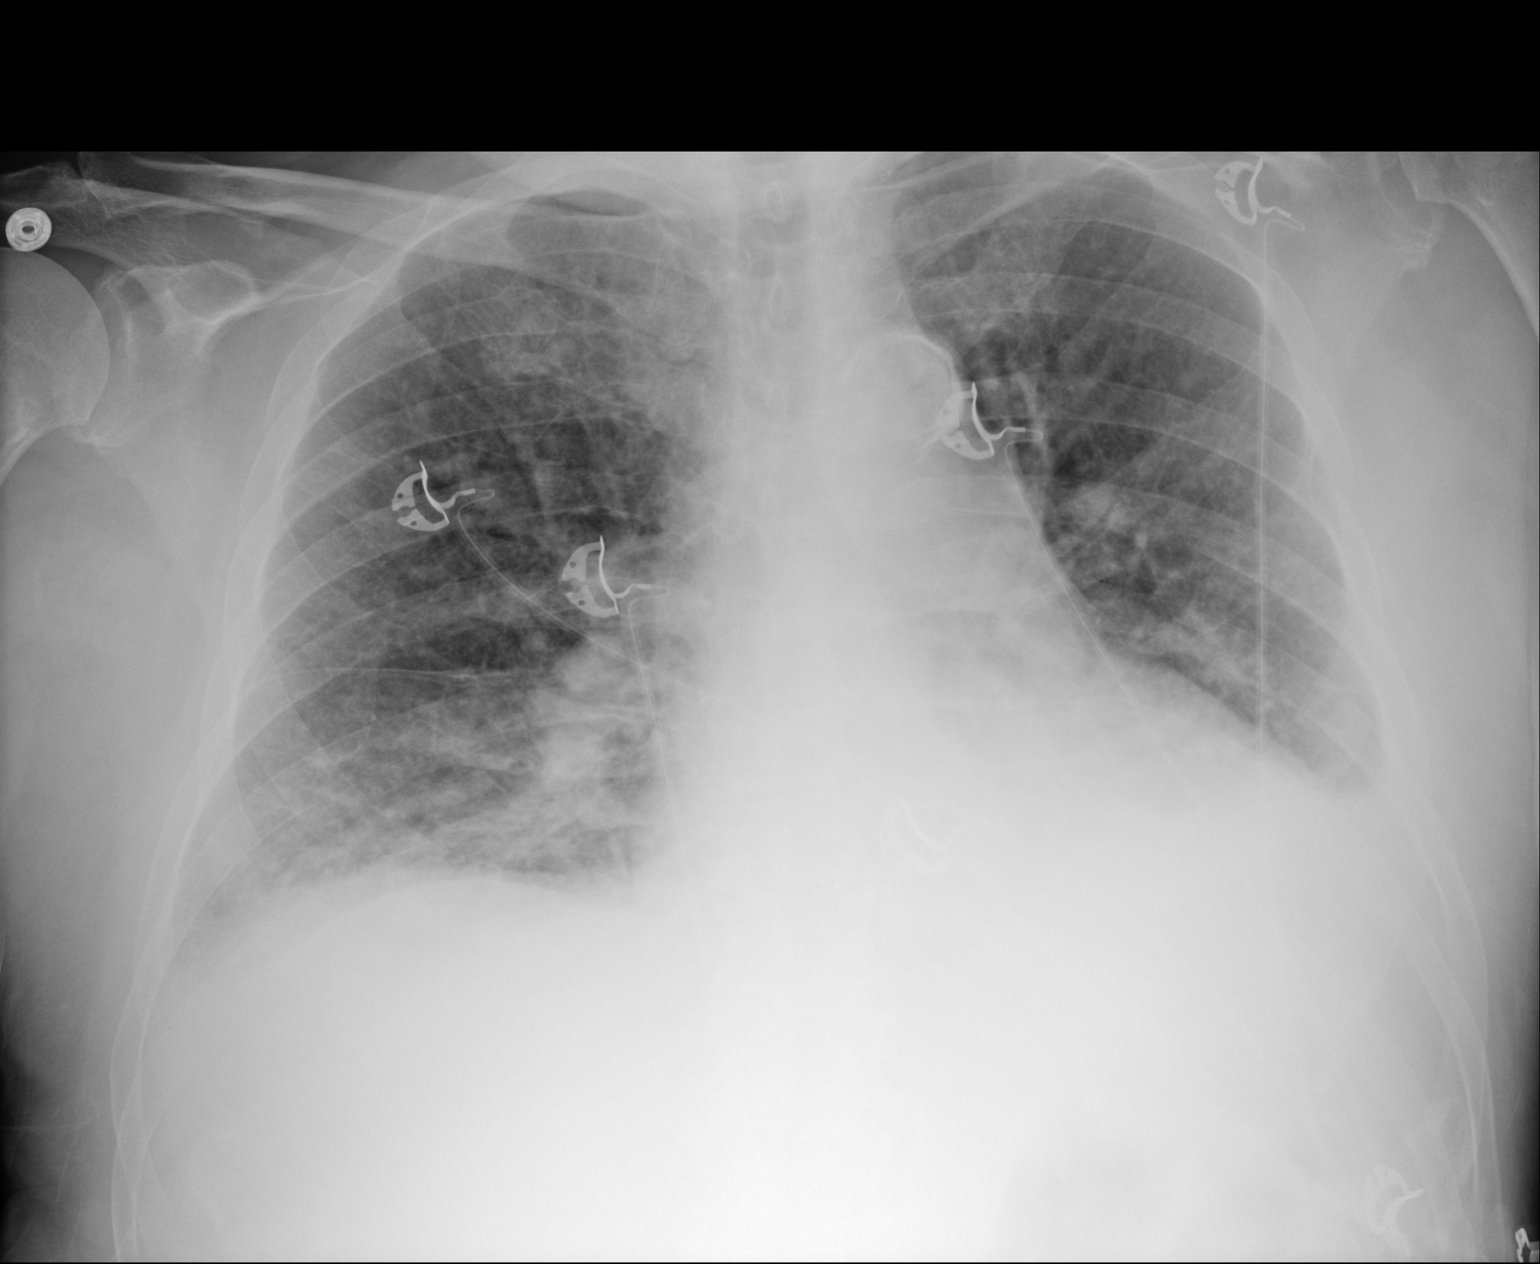
[im 2/2]
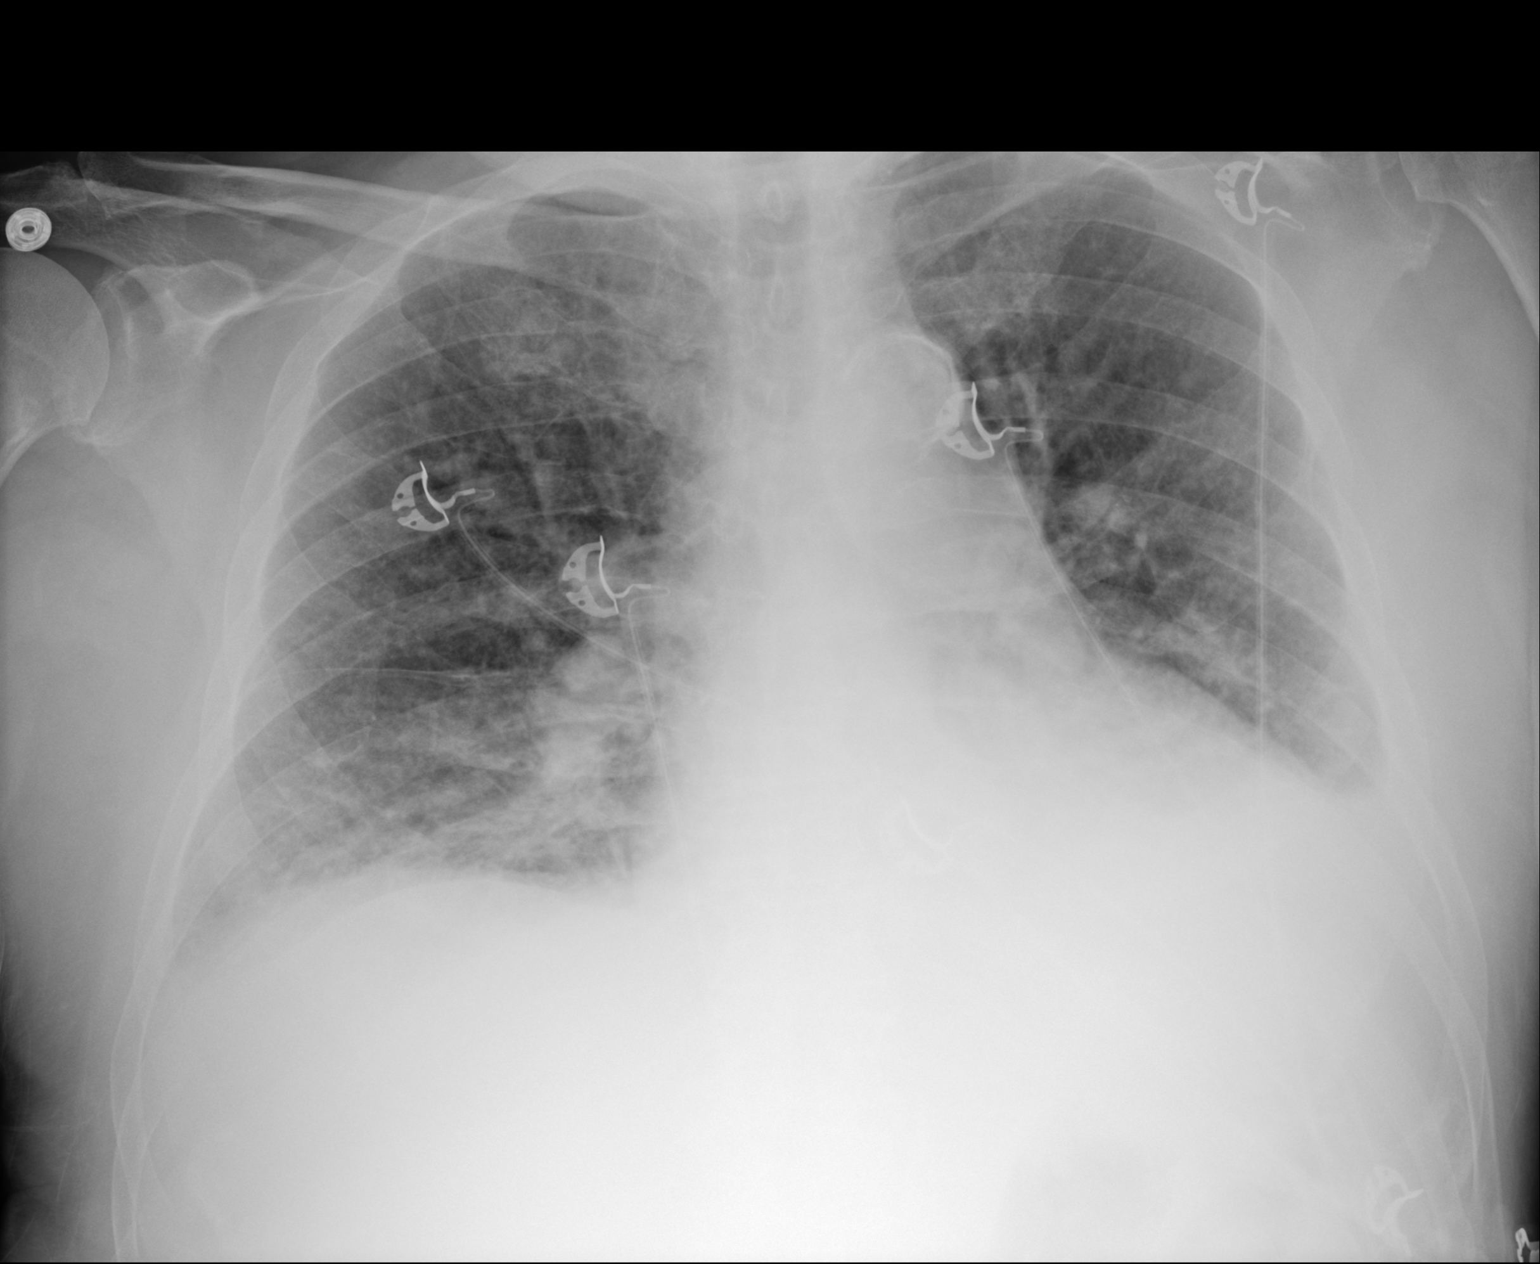

[2 of 2 positions shown; findings below may reference images not displayed]

FINDINGS: There is cardiomegaly with pulmonary venous hypertension. There is
interstitial edema with small pleural effusions. There is no frank
airspace consolidation. No adenopathy. There is aortic
atherosclerosis. No bone lesions.
IMPRESSION: Pulmonary vascular congestion with small pleural effusions and
interstitial edema. Suspect a degree of congestive heart failure. No
consolidation. Aortic Atherosclerosis ([id number]).

## 2021-04-30 IMAGING — CR PORTABLE CHEST - 1 VIEW
1 series · 2 of 2 positions shown · non-contrast
Comparison: 07/29/2018 and earlier studies.

CLINICAL DATA: Followup exam. Intubated patient. Respiratory
failure.

EXAM:
PORTABLE CHEST 1 VIEW

[Series 1: portable · 0.17mm/px · 2 of 2 slices shown]
[im 1/2]
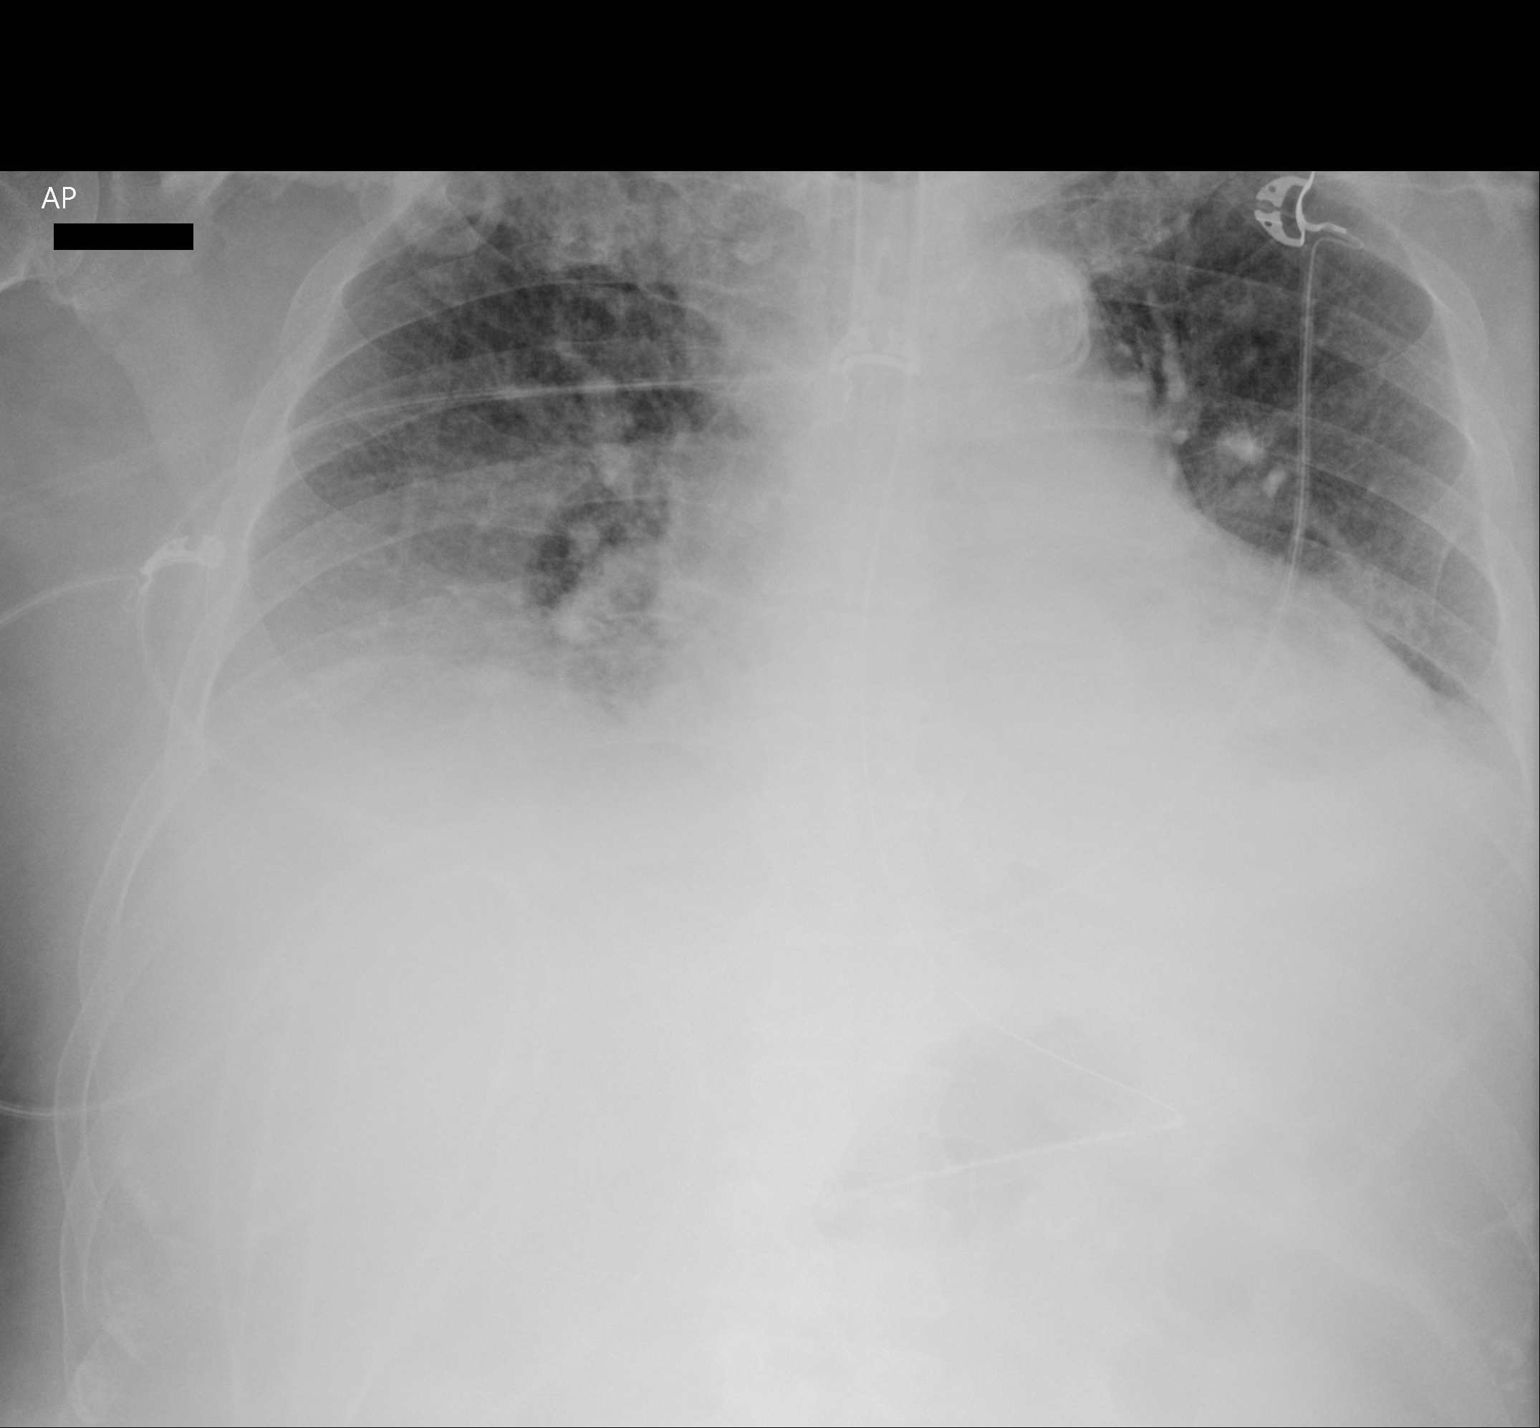
[im 2/2]
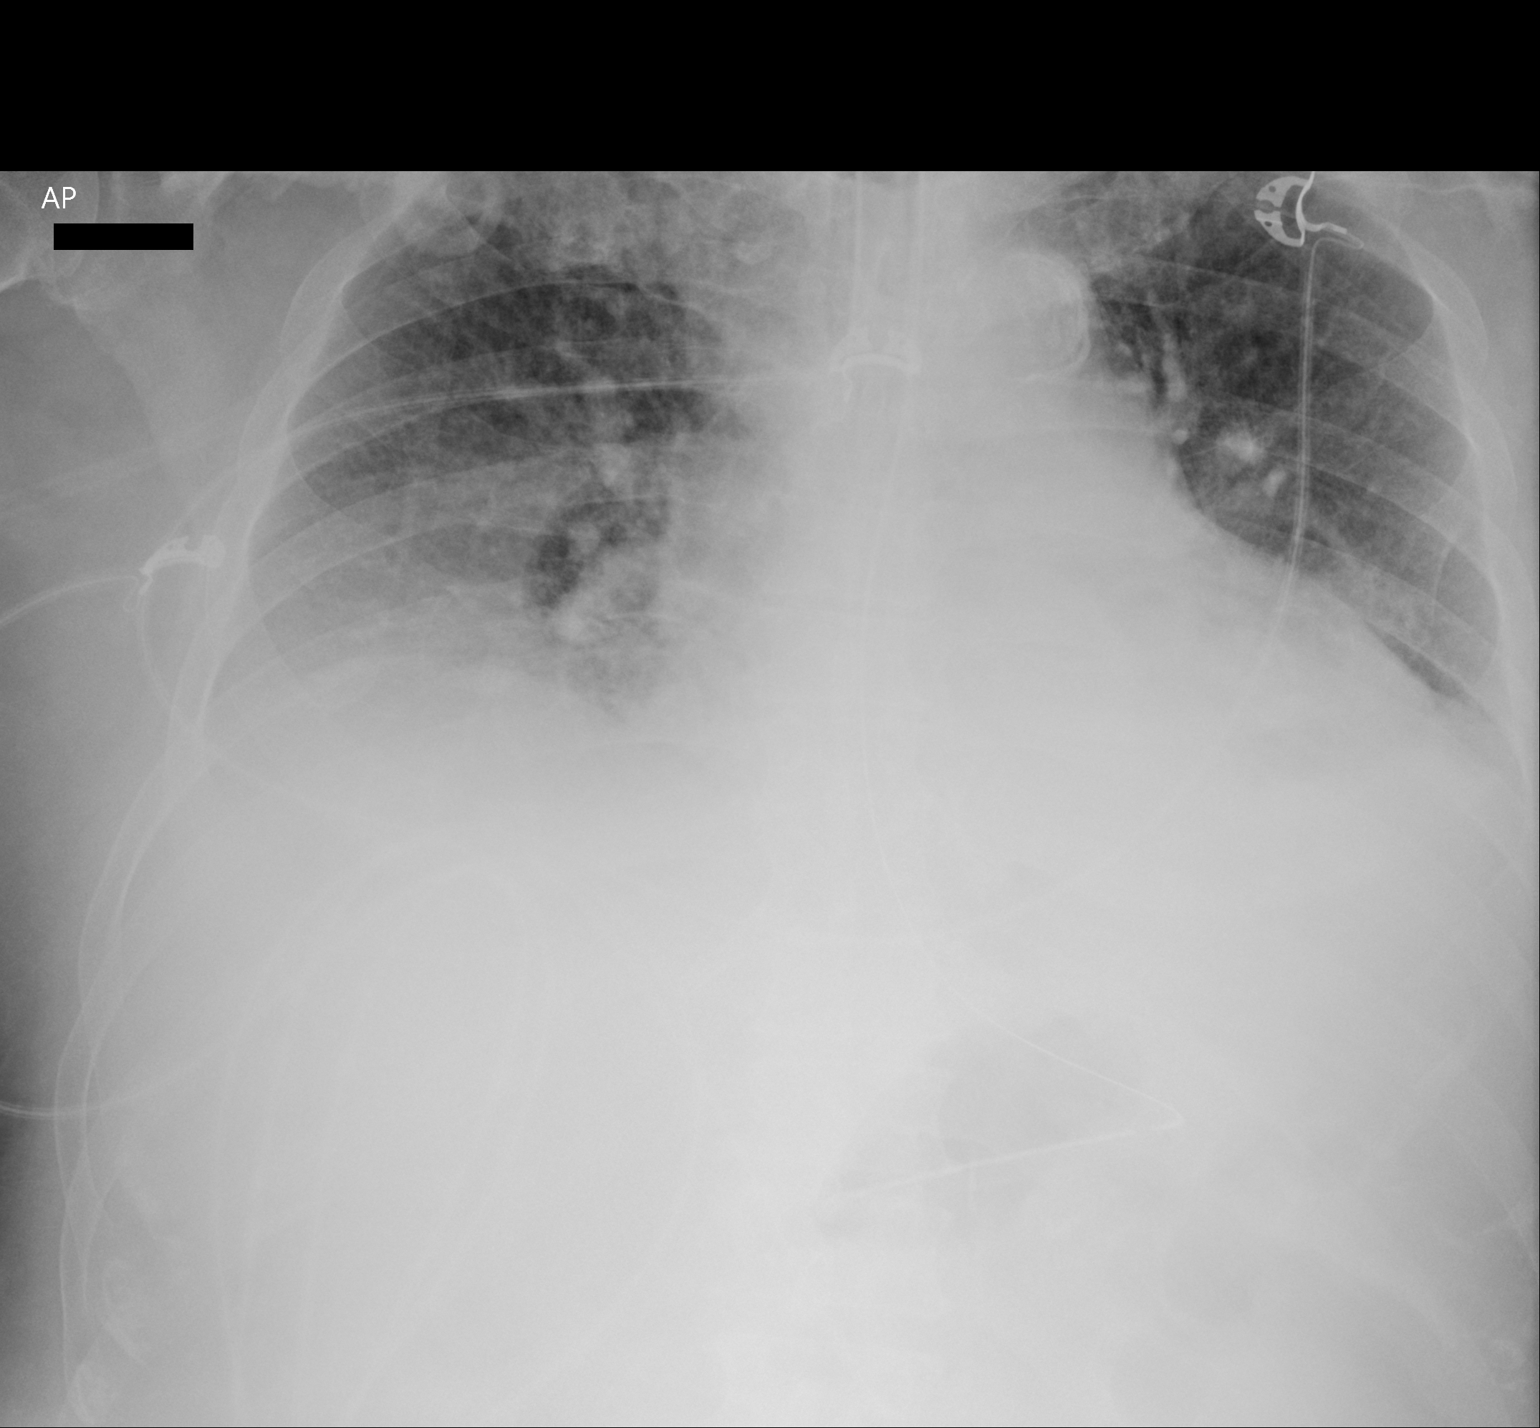

[2 of 2 positions shown; findings below may reference images not displayed]

FINDINGS: Endotracheal tube and nasogastric tube are stable and well
positioned.

Bilateral vascular congestion with lower lung zone opacity
consistent with a combination of pleural effusions and atelectasis.
Findings stable from the most recent prior exam. Lung volumes remain
low.
IMPRESSION: 1. No significant change from the previous day's study.
2. Small bilateral pleural effusions with associated lung base
opacity consistent with atelectasis and/or pneumonia. Central
vascular congestion.
3. Stable well-positioned support apparatus.
